# Patient Record
Sex: Male | Born: 1985 | Race: Black or African American | Hispanic: No | Marital: Married | Smoking: Former smoker
Health system: Southern US, Community
[De-identification: ages and names within clinical notes are randomized; demographics above are authoritative.]

## PROBLEM LIST (undated history)

## (undated) DIAGNOSIS — K469 Unspecified abdominal hernia without obstruction or gangrene: Secondary | ICD-10-CM

## (undated) DIAGNOSIS — J40 Bronchitis, not specified as acute or chronic: Secondary | ICD-10-CM

## (undated) DIAGNOSIS — J45909 Unspecified asthma, uncomplicated: Secondary | ICD-10-CM

---
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
## 2009-06-22 ENCOUNTER — Emergency Department (HOSPITAL_COMMUNITY): Admission: EM | Admit: 2009-06-22 | Discharge: 2009-06-22 | Payer: Self-pay | Admitting: Emergency Medicine

## 2009-12-03 ENCOUNTER — Emergency Department (HOSPITAL_COMMUNITY): Admission: EM | Admit: 2009-12-03 | Discharge: 2009-12-03 | Payer: Self-pay | Admitting: Emergency Medicine

## 2011-01-18 LAB — URINALYSIS, ROUTINE W REFLEX MICROSCOPIC
Bilirubin Urine: NEGATIVE
Glucose, UA: NEGATIVE mg/dL
Hgb urine dipstick: NEGATIVE
Specific Gravity, Urine: 1.036 — ABNORMAL HIGH (ref 1.005–1.030)
pH: 5.5 (ref 5.0–8.0)

## 2011-01-18 LAB — GC/CHLAMYDIA PROBE AMP, URINE: Chlamydia, Swab/Urine, PCR: NEGATIVE

## 2011-03-06 ENCOUNTER — Emergency Department (HOSPITAL_COMMUNITY)
Admission: EM | Admit: 2011-03-06 | Discharge: 2011-03-06 | Disposition: A | Discharge status: Home / Self Care | Payer: Self-pay | Attending: Emergency Medicine | Admitting: Emergency Medicine

## 2011-03-06 DIAGNOSIS — K4090 Unilateral inguinal hernia, without obstruction or gangrene, not specified as recurrent: Secondary | ICD-10-CM | POA: Insufficient documentation

## 2011-03-06 DIAGNOSIS — R1909 Other intra-abdominal and pelvic swelling, mass and lump: Secondary | ICD-10-CM | POA: Insufficient documentation

## 2011-12-19 ENCOUNTER — Emergency Department (HOSPITAL_COMMUNITY): Payer: Self-pay

## 2011-12-19 ENCOUNTER — Encounter (HOSPITAL_COMMUNITY): Payer: Self-pay | Admitting: Emergency Medicine

## 2011-12-19 ENCOUNTER — Emergency Department (HOSPITAL_COMMUNITY)
Admission: EM | Admit: 2011-12-19 | Discharge: 2011-12-20 | Disposition: A | Discharge status: Home Health | Payer: Self-pay | Attending: Emergency Medicine | Admitting: Emergency Medicine

## 2011-12-19 DIAGNOSIS — R062 Wheezing: Secondary | ICD-10-CM | POA: Insufficient documentation

## 2011-12-19 DIAGNOSIS — J3489 Other specified disorders of nose and nasal sinuses: Secondary | ICD-10-CM | POA: Insufficient documentation

## 2011-12-19 DIAGNOSIS — J40 Bronchitis, not specified as acute or chronic: Secondary | ICD-10-CM | POA: Insufficient documentation

## 2011-12-19 DIAGNOSIS — R079 Chest pain, unspecified: Secondary | ICD-10-CM | POA: Insufficient documentation

## 2011-12-19 DIAGNOSIS — R05 Cough: Secondary | ICD-10-CM | POA: Insufficient documentation

## 2011-12-19 DIAGNOSIS — H6120 Impacted cerumen, unspecified ear: Secondary | ICD-10-CM | POA: Insufficient documentation

## 2011-12-19 DIAGNOSIS — R1110 Vomiting, unspecified: Secondary | ICD-10-CM | POA: Insufficient documentation

## 2011-12-19 DIAGNOSIS — F17200 Nicotine dependence, unspecified, uncomplicated: Secondary | ICD-10-CM | POA: Insufficient documentation

## 2011-12-19 DIAGNOSIS — J069 Acute upper respiratory infection, unspecified: Secondary | ICD-10-CM | POA: Insufficient documentation

## 2011-12-19 DIAGNOSIS — R059 Cough, unspecified: Secondary | ICD-10-CM | POA: Insufficient documentation

## 2011-12-19 HISTORY — DX: Bronchitis, not specified as acute or chronic: J40

## 2011-12-19 NOTE — ED Notes (Signed)
PT. REPORTS PRODUCTIVE COUGH WITH SOB AND OCCASIONAL LOW GRADE FEVER FOR 1 WEEK .

## 2011-12-20 MED ORDER — ALBUTEROL SULFATE HFA 108 (90 BASE) MCG/ACT IN AERS: 2.0000 | INHALATION_SPRAY | RESPIRATORY_TRACT | Status: DC | PRN

## 2011-12-20 NOTE — ED Provider Notes (Signed)
History     CSN: [id number]  Arrival date & time 12/19/11  2215   First MD Initiated Contact with Patient 12/20/11 0048      Chief Complaint  Patient presents with  . Cough    (Consider location/radiation/quality/duration/timing/severity/associated sxs/prior treatment) HPI Comments: Adam Armstrong is a 26 y.o. Male with nasal congestion, cough, wheezing, vomiting. He feels better today and is eating well. He has used albuterol in the past, but has not needed it recently. He denies fever. He did not try any medicine at home.  The history is provided by the patient.    Past Medical History  Diagnosis Date  . Bronchitis     History reviewed. No pertinent past surgical history.  No family history on file.  History  Substance Use Topics  . Smoking status: Current Everyday Smoker  . Smokeless tobacco: Not on file  . Alcohol Use: Yes      Review of Systems  All other systems reviewed and are negative.    Allergies  Review of patient's allergies indicates no known allergies.  Home Medications   Current Outpatient Rx  Name Route Sig Dispense Refill  . ACETAMINOPHEN 500 MG PO TABS Oral Take 500 mg by mouth every 6 (six) hours as needed. For pain/fever    . DAYQUIL/NYQUIL COLD/FLU RELIEF PO Oral Take 1 tablet by mouth every 6 (six) hours as needed. For cold symptoms    . ALBUTEROL SULFATE HFA 108 (90 BASE) MCG/ACT IN AERS Inhalation Inhale 2 puffs into the lungs every 4 (four) hours as needed for wheezing. 6.7 g 0    BP 132/82  Pulse 71  Temp(Src) 98.4 F (36.9 C) (Oral)  Resp 20  SpO2 99%  Physical Exam  Nursing note and vitals reviewed. Constitutional: He is oriented to person, place, and time. He appears well-developed and well-nourished.  HENT:  Head: Normocephalic and atraumatic.  Left Ear: External ear normal.       Cerumen impaction, right external auditory canal  Eyes: Conjunctivae and EOM are normal. Pupils are equal, round, and reactive to light.    Neck: Normal range of motion and phonation normal. Neck supple.  Cardiovascular: Normal rate, regular rhythm, normal heart sounds and intact distal pulses.   Pulmonary/Chest: Effort normal. He exhibits no bony tenderness.       Mildly decreased expiratory air movement  Abdominal: Soft. Normal appearance. There is no tenderness.  Musculoskeletal: Normal range of motion.  Neurological: He is alert and oriented to person, place, and time. He has normal strength. No cranial nerve deficit or sensory deficit. He exhibits normal muscle tone. Coordination normal.  Skin: Skin is warm, dry and intact.  Psychiatric: He has a normal mood and affect. His behavior is normal. Judgment and thought content normal.    ED Course  Procedures (including critical care time)  Labs Reviewed - No data to display Dg Chest 2 View  12/19/2011  *RADIOLOGY REPORT*  Clinical Data: Chest pain and cough.  CHEST - 2 VIEW  Comparison: None.  Findings: Heart size and vascularity are normal and the lungs are clear.  No effusions.  No osseous abnormality.  IMPRESSION: Normal chest.  Original Report Authenticated By: Gwynn Burly, M.D.     1. Bronchitis   2. URI (upper respiratory infection)       MDM  URI with bronchitis.Marland Kitchen doubt serious bacterial illness or metabolic instability.   Plan: Home Medications- Albuterol inhaler; Home Treatments- Rest, fluids; Recommended follow up- PCP of choice  prn. Also given name of a Surgeon at his request for a groin hernia discovered several months ago.        Flint Melter, MD 12/20/11 [phone]

## 2011-12-20 NOTE — Discharge Instructions (Signed)
Get  [place] of rest and drink a lot of fluids. See your doctor as needed for problems.  Bronchitis Bronchitis is the body's way of reacting to injury and/or infection (inflammation) of the bronchi. Bronchi are the air tubes that extend from the windpipe into the lungs. If the inflammation becomes severe, it may cause shortness of breath. CAUSES  Inflammation may be caused by:  A virus.   Germs (bacteria).   Dust.   Allergens.   Pollutants and many other irritants.  The cells lining the bronchial tree are covered with tiny hairs (cilia). These constantly beat upward, away from the lungs, toward the mouth. This keeps the lungs free of pollutants. When these cells become too irritated and are unable to do their job, mucus begins to develop. This causes the characteristic cough of bronchitis. The cough clears the lungs when the cilia are unable to do their job. Without either of these protective mechanisms, the mucus would settle in the lungs. Then you would develop pneumonia. Smoking is a common cause of bronchitis and can contribute to pneumonia. Stopping this habit is the single most important thing you can do to help yourself. TREATMENT   Your caregiver may prescribe an antibiotic if the cough is caused by bacteria. Also, medicines that open up your airways make it easier to breathe. Your caregiver may also recommend or prescribe an expectorant. It will loosen the mucus to be coughed up. Only take over-the-counter or prescription medicines for pain, discomfort, or fever as directed by your caregiver.   Removing whatever causes the problem (smoking, for example) is critical to preventing the problem from getting worse.   Cough suppressants may be prescribed for relief of cough symptoms.   Inhaled medicines may be prescribed to help with symptoms now and to help prevent problems from returning.   For those with recurrent (chronic) bronchitis, there may be a need for steroid medicines.    SEEK IMMEDIATE MEDICAL CARE IF:   During treatment, you develop more pus-like mucus (purulent sputum).   You have a fever.   Your baby is older than 3 months with a rectal temperature of 102 F (38.9 C) or higher.   Your baby is 70 months old or younger with a rectal temperature of 100.4 F (38 C) or higher.   You become progressively more ill.   You have increased difficulty breathing, wheezing, or shortness of breath.  It is necessary to seek immediate medical care if you are elderly or sick from any other disease. MAKE SURE YOU:   Understand these instructions.   Will watch your condition.   Will get help right away if you are not doing well or get worse.  Document Released: 09/30/2005 Document Revised: 09/19/2011 Document Reviewed: 08/09/2008 [facility] Patient Information 2012 [place], [place].Upper Respiratory Infection, Adult An upper respiratory infection (URI) is also known as the common cold. It is often caused by a type of germ (virus). Colds are easily spread (contagious). You can pass it to others by kissing, coughing, sneezing, or drinking out of the same glass. Usually, you get better in 1 or 2 weeks.  HOME CARE   Only take medicine as told by your doctor.   Use a warm mist humidifier or breathe in steam from a hot shower.   Drink enough water and fluids to keep your pee (urine) clear or pale yellow.   Get plenty of rest.   Return to work when your temperature is back to normal or as told by  your doctor. You may use a face mask and wash your hands to stop your cold from spreading.  GET HELP RIGHT AWAY IF:   After the first few days, you feel you are getting worse.   You have questions about your medicine.   You have chills, shortness of breath, or brown or red spit (mucus).   You have yellow or brown snot (nasal discharge) or pain in the face, especially when you bend forward.   You have a fever, puffy (swollen) neck, pain when you swallow, or white spots  in the back of your throat.   You have a bad headache, ear pain, sinus pain, or chest pain.   You have a high-pitched whistling sound when you breathe in and out (wheezing).   You have a lasting cough or cough up blood.   You have sore muscles or a stiff neck.  MAKE SURE YOU:   Understand these instructions.   Will watch your condition.   Will get help right away if you are not doing well or get worse.  Document Released: 03/18/2008 Document Revised: 09/19/2011 Document Reviewed: 02/04/2011 [facility] Patient Information 2012 [place], [place].

## 2012-01-26 ENCOUNTER — Encounter (HOSPITAL_COMMUNITY): Payer: Self-pay | Admitting: *Deleted

## 2012-01-26 ENCOUNTER — Emergency Department (HOSPITAL_COMMUNITY)
Admission: EM | Admit: 2012-01-26 | Discharge: 2012-01-26 | Discharge status: Against Medical Advice | Payer: Self-pay | Attending: Emergency Medicine | Admitting: Emergency Medicine

## 2012-01-26 DIAGNOSIS — K4090 Unilateral inguinal hernia, without obstruction or gangrene, not specified as recurrent: Secondary | ICD-10-CM | POA: Insufficient documentation

## 2012-01-26 DIAGNOSIS — R339 Retention of urine, unspecified: Secondary | ICD-10-CM | POA: Insufficient documentation

## 2012-01-26 NOTE — ED Notes (Signed)
No answer when called 

## 2012-01-26 NOTE — ED Notes (Signed)
The pt has a hernia  In the inguinal area and he has had difficulty urinating for one hour

## 2012-01-29 ENCOUNTER — Emergency Department (HOSPITAL_COMMUNITY)
Admission: EM | Admit: 2012-01-29 | Discharge: 2012-01-29 | Disposition: A | Discharge status: Home / Self Care | Payer: Self-pay | Attending: Emergency Medicine | Admitting: Emergency Medicine

## 2012-01-29 ENCOUNTER — Encounter (HOSPITAL_COMMUNITY): Payer: Self-pay | Admitting: Emergency Medicine

## 2012-01-29 DIAGNOSIS — K4090 Unilateral inguinal hernia, without obstruction or gangrene, not specified as recurrent: Secondary | ICD-10-CM | POA: Insufficient documentation

## 2012-01-29 DIAGNOSIS — Z79899 Other long term (current) drug therapy: Secondary | ICD-10-CM | POA: Insufficient documentation

## 2012-01-29 DIAGNOSIS — F17200 Nicotine dependence, unspecified, uncomplicated: Secondary | ICD-10-CM | POA: Insufficient documentation

## 2012-01-29 LAB — URINALYSIS, ROUTINE W REFLEX MICROSCOPIC
Ketones, ur: NEGATIVE mg/dL
Leukocytes, UA: NEGATIVE
Nitrite: NEGATIVE
Specific Gravity, Urine: 1.021 (ref 1.005–1.030)
pH: 6.5 (ref 5.0–8.0)

## 2012-01-29 MED ORDER — HYDROCODONE-ACETAMINOPHEN 5-325 MG PO TABS: 1.0000 | ORAL_TABLET | ORAL | Status: AC | PRN

## 2012-01-29 NOTE — Discharge Instructions (Signed)
Inguinal Hernia, Adult Muscles help keep everything in the body in its proper place. But if a weak spot in the muscles develops, something can poke through. That is called a hernia. When this happens in the lower part of the belly (abdomen), it is called an inguinal hernia. (It takes its name from a part of the body in this region called the inguinal canal.) A weak spot in the wall of muscles lets some fat or part of the small intestine bulge through. An inguinal hernia can develop at any age. Men get them more often than women. CAUSES  In adults, an inguinal hernia develops over time.  It can be triggered by:   Suddenly straining the muscles of the lower abdomen.   Lifting heavy objects.   Straining to have a bowel movement. Difficult bowel movements (constipation) can lead to this.   Constant coughing. This may be caused by smoking or lung disease.   Being overweight.   Being pregnant.   Working at a job that requires long periods of standing or heavy lifting.   Having had an inguinal hernia before.  One type can be an emergency situation. It is called a strangulated inguinal hernia. It develops if part of the small intestine slips through the weak spot and cannot get back into the abdomen. The blood supply can be cut off. If that happens, part of the intestine may die. This situation requires emergency surgery. SYMPTOMS  Often, a small inguinal hernia has no symptoms. It is found when a healthcare provider does a physical exam. Larger hernias usually have symptoms.   In adults, symptoms may include:   A lump in the groin. This is easier to see when the person is standing. It might disappear when lying down.   In men, a lump in the scrotum.   Pain or burning in the groin. This occurs especially when lifting, straining or coughing.   A dull ache or feeling of pressure in the groin.   Signs of a strangulated hernia can include:   A bulge in the groin that becomes very painful  and tender to the touch.   A bulge that turns red or purple.   Fever, nausea and vomiting.   Inability to have a bowel movement or to pass gas.  DIAGNOSIS  To decide if you have an inguinal hernia, a healthcare provider will probably do a physical examination.  This will include asking questions about any symptoms you have noticed.   The healthcare provider might feel the groin area and ask you to cough. If an inguinal hernia is felt, the healthcare provider may try to slide it back into the abdomen.   Usually no other tests are needed.  TREATMENT  Treatments can vary. The size of the hernia makes a difference. Options include:  Watchful waiting. This is often suggested if the hernia is small and you have had no symptoms.   No medical procedure will be done unless symptoms develop.   You will need to watch closely for symptoms. If any occur, contact your healthcare provider right away.   Surgery. This is used if the hernia is larger or you have symptoms.   Open surgery. This is usually an outpatient procedure (you will not stay overnight in a hospital). An cut (incision) is made through the skin in the groin. The hernia is put back inside the abdomen. The weak area in the muscles is then repaired by herniorrhaphy or hernioplasty. Herniorrhaphy: in this type of   surgery, the weak muscles are sewn back together. Hernioplasty: a patch or mesh is used to close the weak area in the abdominal wall.   Laparoscopy. In this procedure, a surgeon makes small incisions. A thin tube with a tiny video camera (called a laparoscope) is put into the abdomen. The surgeon repairs the hernia with mesh by looking with the video camera and using two long instruments.  HOME CARE INSTRUCTIONS   After surgery to repair an inguinal hernia:   You will need to take pain medicine prescribed by your healthcare provider. Follow all directions carefully.   You will need to take care of the wound from the incision.    Your activity will be restricted for awhile. This will probably include no heavy lifting for several weeks. You also should not do anything too active for a few weeks. When you can return to work will depend on the type of job that you have.   During "watchful waiting" periods, you should:   Maintain a healthy weight.   Eat a diet high in fiber (fruits, vegetables and whole grains).   Drink plenty of fluids to avoid constipation. This means drinking enough water and other liquids to keep your urine clear or pale yellow.   Do not lift heavy objects.   Do not stand for long periods of time.   Quit smoking. This should keep you from developing a frequent cough.  SEEK MEDICAL CARE IF:   A bulge develops in your groin area.   You feel pain, a burning sensation or pressure in the groin. This might be worse if you are lifting or straining.   You develop a fever of more than 100.5 F (38.1 C).  SEEK IMMEDIATE MEDICAL CARE IF:   Pain in the groin increases suddenly.   A bulge in the groin gets bigger suddenly and does not go down.   For men, there is sudden pain in the scrotum. Or, the size of the scrotum increases.   A bulge in the groin area becomes red or purple and is painful to touch.   You have nausea or vomiting that does not go away.   You feel your heart beating much faster than normal.   You cannot have a bowel movement or pass gas.   You develop a fever of more than 102.0 F (38.9 C).  Document Released: 02/16/2009 Document Revised: 09/19/2011 Document Reviewed: 02/16/2009 [facility] Patient Information 2012 [place], [place].   Inguinal Hernia, Adult Muscles help keep everything in the body in its proper place. But if a weak spot in the muscles develops, something can poke through. That is called a hernia. When this happens in the lower part of the belly (abdomen), it is called an inguinal hernia. (It takes its name from a part of the body in this region called the  inguinal canal.) A weak spot in the wall of muscles lets some fat or part of the small intestine bulge through. An inguinal hernia can develop at any age. Men get them more often than women. CAUSES  In adults, an inguinal hernia develops over time.  It can be triggered by:   Suddenly straining the muscles of the lower abdomen.   Lifting heavy objects.   Straining to have a bowel movement. Difficult bowel movements (constipation) can lead to this.   Constant coughing. This may be caused by smoking or lung disease.   Being overweight.   Being pregnant.   Working at a job that requires  long periods of standing or heavy lifting.   Having had an inguinal hernia before.  One type can be an emergency situation. It is called a strangulated inguinal hernia. It develops if part of the small intestine slips through the weak spot and cannot get back into the abdomen. The blood supply can be cut off. If that happens, part of the intestine may die. This situation requires emergency surgery. SYMPTOMS  Often, a small inguinal hernia has no symptoms. It is found when a healthcare provider does a physical exam. Larger hernias usually have symptoms.   In adults, symptoms may include:   A lump in the groin. This is easier to see when the person is standing. It might disappear when lying down.   In men, a lump in the scrotum.   Pain or burning in the groin. This occurs especially when lifting, straining or coughing.   A dull ache or feeling of pressure in the groin.   Signs of a strangulated hernia can include:   A bulge in the groin that becomes very painful and tender to the touch.   A bulge that turns red or purple.   Fever, nausea and vomiting.   Inability to have a bowel movement or to pass gas.  DIAGNOSIS  To decide if you have an inguinal hernia, a healthcare provider will probably do a physical examination.  This will include asking questions about any symptoms you have noticed.    The healthcare provider might feel the groin area and ask you to cough. If an inguinal hernia is felt, the healthcare provider may try to slide it back into the abdomen.   Usually no other tests are needed.  TREATMENT  Treatments can vary. The size of the hernia makes a difference. Options include:  Watchful waiting. This is often suggested if the hernia is small and you have had no symptoms.   No medical procedure will be done unless symptoms develop.   You will need to watch closely for symptoms. If any occur, contact your healthcare provider right away.   Surgery. This is used if the hernia is larger or you have symptoms.   Open surgery. This is usually an outpatient procedure (you will not stay overnight in a hospital). An cut (incision) is made through the skin in the groin. The hernia is put back inside the abdomen. The weak area in the muscles is then repaired by herniorrhaphy or hernioplasty. Herniorrhaphy: in this type of surgery, the weak muscles are sewn back together. Hernioplasty: a patch or mesh is used to close the weak area in the abdominal wall.   Laparoscopy. In this procedure, a surgeon makes small incisions. A thin tube with a tiny video camera (called a laparoscope) is put into the abdomen. The surgeon repairs the hernia with mesh by looking with the video camera and using two long instruments.  HOME CARE INSTRUCTIONS   After surgery to repair an inguinal hernia:   You will need to take pain medicine prescribed by your healthcare provider. Follow all directions carefully.   You will need to take care of the wound from the incision.   Your activity will be restricted for awhile. This will probably include no heavy lifting for several weeks. You also should not do anything too active for a few weeks. When you can return to work will depend on the type of job that you have.   During "watchful waiting" periods, you should:   Maintain a healthy weight.  Eat a diet  high in fiber (fruits, vegetables and whole grains).   Drink plenty of fluids to avoid constipation. This means drinking enough water and other liquids to keep your urine clear or pale yellow.   Do not lift heavy objects.   Do not stand for long periods of time.   Quit smoking. This should keep you from developing a frequent cough.  SEEK MEDICAL CARE IF:   A bulge develops in your groin area.   You feel pain, a burning sensation or pressure in the groin. This might be worse if you are lifting or straining.   You develop a fever of more than 100.5 F (38.1 C).  SEEK IMMEDIATE MEDICAL CARE IF:   Pain in the groin increases suddenly.   A bulge in the groin gets bigger suddenly and does not go down.   For men, there is sudden pain in the scrotum. Or, the size of the scrotum increases.   A bulge in the groin area becomes red or purple and is painful to touch.   You have nausea or vomiting that does not go away.   You feel your heart beating much faster than normal.   You cannot have a bowel movement or pass gas.   You develop a fever of more than 102.0 F (38.9 C).  Document Released: 02/16/2009 Document Revised: 09/19/2011 Document Reviewed: 02/16/2009 [facility] Patient Information 2012 [place], [place].   Call Dr. Andrey Campanile for further management of your hernia.  Avoid lifting heavy objects until this gets repaired.  In the interim, return here if you develop any swelling and pain that does not get better with gentle pressure.  You may take the hydrocodone prescribed for pain relief.  This will make you drowsy - do not drive within 4 hours of taking this medication.

## 2012-01-29 NOTE — ED Notes (Signed)
Pt notified that urine sample is needed 

## 2012-01-29 NOTE — ED Notes (Signed)
PT. REPORTS LEFT GROIN PAIN / PRESSURE FOR 4 DAYS , DENIES INJURY , PT. CONCERNED ABOUT HERNIA.

## 2012-02-01 NOTE — ED Provider Notes (Signed)
History     CSN: [id number]  Arrival date & time 01/29/12  1928   First MD Initiated Contact with Patient 01/29/12 2111      Chief Complaint  Patient presents with  . Groin Pain    (Consider location/radiation/quality/duration/timing/severity/associated sxs/prior treatment) HPI Comments: Adam Armstrong presents for a recheck of a known left inguinal hernia which he was diagnosed here months ago.  He reports more frequent episodes of sharp pain in his left groin over the past 4 days which is worsened with lifting (lifts with his job, but has also started a new weight training regimen) and resolves by applying gentle pressure at the site.  He lost the surgical referral he was given at his last visit and requests this information tonight so he can get definitive treatment of this. He denies fevers,  Chills, nausea,  Vomiting,  Diarrhea,  Denies rectal bleeding or change in bowel habits.     Patient is a 26 y.o. male presenting with groin pain. The history is provided by the patient.  Groin Pain This is a recurrent problem. The current episode started in the past 7 days. Associated symptoms include abdominal pain. Pertinent negatives include no arthralgias, chest pain, congestion, fever, headaches, joint swelling, nausea, neck pain, numbness, rash, sore throat or weakness.    Past Medical History  Diagnosis Date  . Bronchitis     History reviewed. No pertinent past surgical history.  No family history on file.  History  Substance Use Topics  . Smoking status: Current Everyday Smoker  . Smokeless tobacco: Not on file  . Alcohol Use: Yes      Review of Systems  Constitutional: Negative for fever.  HENT: Negative for congestion, sore throat and neck pain.   Eyes: Negative.   Respiratory: Negative for chest tightness and shortness of breath.   Cardiovascular: Negative for chest pain.  Gastrointestinal: Positive for abdominal pain. Negative for nausea.  Genitourinary:  Negative.  Negative for penile swelling, scrotal swelling, difficulty urinating and testicular pain.  Musculoskeletal: Negative for joint swelling and arthralgias.  Skin: Negative.  Negative for rash and wound.  Neurological: Negative for dizziness, weakness, light-headedness, numbness and headaches.  Hematological: Negative.     Allergies  Review of patient's allergies indicates no known allergies.  Home Medications   Current Outpatient Rx  Name Route Sig Dispense Refill  . ALBUTEROL SULFATE HFA 108 (90 BASE) MCG/ACT IN AERS Inhalation Inhale 2 puffs into the lungs every 4 (four) hours as needed for wheezing. 6.7 g 0  . IBUPROFEN 200 MG PO TABS Oral Take 400 mg by mouth every 6 (six) hours as needed. For pain    . HYDROCODONE-ACETAMINOPHEN 5-325 MG PO TABS Oral Take 1 tablet by mouth every 4 (four) hours as needed for pain. 15 tablet 0    BP 125/64  Pulse 71  Temp(Src) 98.5 F (36.9 C) (Oral)  Resp 18  SpO2 98%  Physical Exam  Nursing note and vitals reviewed. Constitutional: He appears well-developed and well-nourished.  HENT:  Head: Normocephalic and atraumatic.  Eyes: Conjunctivae are normal.  Neck: Normal range of motion.  Cardiovascular: Normal rate, regular rhythm, normal heart sounds and intact distal pulses.   Pulmonary/Chest: Effort normal and breath sounds normal. He has no wheezes.  Abdominal: Soft. Bowel sounds are normal. He exhibits mass. There is no tenderness.       Left inguinal swelling which worsens with valsalva and resolves completely with gentle pressure while supine.   Genitourinary: Right  testis shows no mass, no swelling and no tenderness. Left testis shows no mass, no swelling and no tenderness.  Musculoskeletal: Normal range of motion.  Neurological: He is alert.  Skin: Skin is warm and dry.  Psychiatric: He has a normal mood and affect.    ED Course  Procedures (including critical care time)   Labs Reviewed  URINALYSIS, ROUTINE W REFLEX  MICROSCOPIC  LAB REPORT - SCANNED   No results found.   1. Direct inguinal hernia     Chaperone was present during exam.   MDM  Easily reducible direct inguinal hernia.  Referral to gen surg given.  Strict return precautions given- return here if worse pain that does not improve with gentle pressure, fevers,  Vomiting.        Candis Musa, PA 02/01/12 [phone]

## 2012-02-02 NOTE — ED Provider Notes (Signed)
Medical screening examination/treatment/procedure(s) were performed by non-physician practitioner and as supervising physician I was immediately available for consultation/collaboration.   Glynn Octave, MD 02/02/12 1438

## 2012-06-14 ENCOUNTER — Emergency Department (HOSPITAL_COMMUNITY)
Admission: EM | Admit: 2012-06-14 | Discharge: 2012-06-14 | Disposition: A | Discharge status: Home / Self Care | Payer: Self-pay | Attending: Emergency Medicine | Admitting: Emergency Medicine

## 2012-06-14 ENCOUNTER — Encounter (HOSPITAL_COMMUNITY): Payer: Self-pay | Admitting: *Deleted

## 2012-06-14 DIAGNOSIS — K625 Hemorrhage of anus and rectum: Secondary | ICD-10-CM | POA: Insufficient documentation

## 2012-06-14 DIAGNOSIS — F17200 Nicotine dependence, unspecified, uncomplicated: Secondary | ICD-10-CM | POA: Insufficient documentation

## 2012-06-14 DIAGNOSIS — F1210 Cannabis abuse, uncomplicated: Secondary | ICD-10-CM | POA: Insufficient documentation

## 2012-06-14 HISTORY — DX: Unspecified abdominal hernia without obstruction or gangrene: K46.9

## 2012-06-14 LAB — CBC WITH DIFFERENTIAL/PLATELET
Basophils Absolute: 0 K/uL (ref 0.0–0.1)
Basophils Relative: 0 % (ref 0–1)
Eosinophils Absolute: 0.6 K/uL (ref 0.0–0.7)
Eosinophils Relative: 8 % — ABNORMAL HIGH (ref 0–5)
HCT: 40.3 % (ref 39.0–52.0)
Hemoglobin: 13.4 g/dL (ref 13.0–17.0)
Lymphocytes Relative: 23 % (ref 12–46)
Lymphs Abs: 1.8 K/uL (ref 0.7–4.0)
MCH: 28.2 pg (ref 26.0–34.0)
MCHC: 33.3 g/dL (ref 30.0–36.0)
MCV: 84.7 fL (ref 78.0–100.0)
Monocytes Absolute: 1 K/uL (ref 0.1–1.0)
Monocytes Relative: 13 % — ABNORMAL HIGH (ref 3–12)
Neutro Abs: 4.4 K/uL (ref 1.7–7.7)
Neutrophils Relative %: 56 % (ref 43–77)
Platelets: 208 K/uL (ref 150–400)
RBC: 4.76 MIL/uL (ref 4.22–5.81)
RDW: 14.3 % (ref 11.5–15.5)
WBC: 7.9 K/uL (ref 4.0–10.5)

## 2012-06-14 LAB — COMPREHENSIVE METABOLIC PANEL WITH GFR
ALT: 28 U/L (ref 0–53)
AST: 32 U/L (ref 0–37)
Albumin: 4.3 g/dL (ref 3.5–5.2)
Alkaline Phosphatase: 61 U/L (ref 39–117)
BUN: 17 mg/dL (ref 6–23)
CO2: 24 meq/L (ref 19–32)
Calcium: 9.6 mg/dL (ref 8.4–10.5)
Chloride: 105 meq/L (ref 96–112)
Creatinine, Ser: 1.04 mg/dL (ref 0.50–1.35)
GFR calc Af Amer: >90 mL/min
GFR calc non Af Amer: >90 mL/min
Glucose, Bld: 104 mg/dL — ABNORMAL HIGH (ref 70–99)
Potassium: 4 meq/L (ref 3.5–5.1)
Sodium: 138 meq/L (ref 135–145)
Total Bilirubin: 0.3 mg/dL (ref 0.3–1.2)
Total Protein: 7.5 g/dL (ref 6.0–8.3)

## 2012-06-14 MED ORDER — DOCUSATE SODIUM 250 MG PO CAPS: 100.0000 mg | ORAL_CAPSULE | Freq: Every day | ORAL | Status: AC

## 2012-06-14 MED ORDER — ALBUTEROL SULFATE HFA 108 (90 BASE) MCG/ACT IN AERS: 2.0000 | INHALATION_SPRAY | RESPIRATORY_TRACT | Status: AC | PRN

## 2012-06-14 NOTE — ED Notes (Signed)
Pt reports lower abdominal pain and rectal bleeding since this am, reports no hx of the same.

## 2012-06-14 NOTE — ED Provider Notes (Addendum)
History     CSN: [id number]  Arrival date & time 06/14/12  4098   First MD Initiated Contact with Patient 06/14/12 1117      Chief Complaint  Patient presents with  . Rectal Bleeding  . Abdominal Pain    (Consider location/radiation/quality/duration/timing/severity/associated sxs/prior treatment) HPI Complains of pain at anus and blood mixed with stool onset this morning. Patient has had vague lower abdominal pain this morning, feeling that he needs to have a bowel movement. He treated himself with a laxative subsequently had pain at anus and blood mixed bowel movement. No other associated symptoms. No fever no vomiting. Pain at anus worse with bowel movement  Past Medical History  Diagnosis Date  . Bronchitis   . Hernia     History reviewed. No pertinent past surgical history.  History reviewed. No pertinent family history.  History  Substance Use Topics  . Smoking status: Current Everyday Smoker  . Smokeless tobacco: Not on file  . Alcohol Use: Yes     occ   positive marijuana use    Review of Systems  Constitutional: Negative.   HENT: Negative.  Negative for drooling.   Respiratory: Negative.   Cardiovascular: Negative.   Gastrointestinal: Positive for abdominal pain, blood in stool and rectal pain.  Musculoskeletal: Negative.   Skin: Negative.   Neurological: Negative.   Hematological: Negative.   Psychiatric/Behavioral: Negative.     Allergies  Review of patient's allergies indicates no known allergies.  Home Medications   Current Outpatient Rx  Name Route Sig Dispense Refill  . ALBUTEROL SULFATE HFA 108 (90 BASE) MCG/ACT IN AERS Inhalation Inhale 2 puffs into the lungs every 4 (four) hours as needed for wheezing. 6.7 g 0  . BC HEADACHE POWDER PO Oral Take 1 packet by mouth daily as needed. For headache    . B COMPLEX PO TABS Oral Take 1 tablet by mouth daily.    Marland Kitchen VITAMIN B-12 PO Oral Take 1 tablet by mouth daily.    Jule Ser EX Apply externally  Apply 1 application topically daily as needed. For knee pain    . ADULT MULTIVITAMIN W/MINERALS CH Oral Take 1 tablet by mouth daily.    Marland Kitchen FISH OIL PO Oral Take 2 capsules by mouth daily.    Marland Kitchen OVER THE COUNTER MEDICATION Oral Take 1 tablet by mouth 2 (two) times daily as needed. [facility] brand bronchial pills    . VITAMIN B-6 PO Oral Take 1 tablet by mouth daily.      BP 133/87  Pulse 64  Temp 99.1 F (37.3 C) (Oral)  Resp 16  SpO2 99%  Physical Exam  Genitourinary: Prostate normal and penis normal.       Brown stool mixed with slight amount of gross blood on rectal exam rectal exam grossly tender to    ED Course  Procedures (including critical care time)  Labs Reviewed  CBC WITH DIFFERENTIAL - Abnormal; Notable for the following:    Monocytes Relative 13 (*)     Eosinophils Relative 8 (*)     All other components within normal limits  COMPREHENSIVE METABOLIC PANEL - Abnormal; Notable for the following:    Glucose, Bld 104 (*)     All other components within normal limits   No results found. Procedure: Anoscopy performed by me. Timeout performed no bleeding site visualized  No diagnosis found.  Results for orders placed during the hospital encounter of 06/14/12  CBC WITH DIFFERENTIAL      Component  Value Range   WBC 7.9  4.0 - 10.5 K/uL   RBC 4.76  4.22 - 5.81 MIL/uL   Hemoglobin 13.4  13.0 - 17.0 g/dL   HCT 16.1  09.6 - 04.5 %   MCV 84.7  78.0 - 100.0 fL   MCH 28.2  26.0 - 34.0 pg   MCHC 33.3  30.0 - 36.0 g/dL   RDW 40.9  81.1 - 91.4 %   Platelets 208  150 - 400 K/uL   Neutrophils Relative 56  43 - 77 %   Neutro Abs 4.4  1.7 - 7.7 K/uL   Lymphocytes Relative 23  12 - 46 %   Lymphs Abs 1.8  0.7 - 4.0 K/uL   Monocytes Relative 13 (*) 3 - 12 %   Monocytes Absolute 1.0  0.1 - 1.0 K/uL   Eosinophils Relative 8 (*) 0 - 5 %   Eosinophils Absolute 0.6  0.0 - 0.7 K/uL   Basophils Relative 0  0 - 1 %   Basophils Absolute 0.0  0.0 - 0.1 K/uL  COMPREHENSIVE METABOLIC  PANEL      Component Value Range   Sodium 138  135 - 145 mEq/L   Potassium 4.0  3.5 - 5.1 mEq/L   Chloride 105  96 - 112 mEq/L   CO2 24  19 - 32 mEq/L   Glucose, Bld 104 (*) 70 - 99 mg/dL   BUN 17  6 - 23 mg/dL   Creatinine, Ser 7.82  0.50 - 1.35 mg/dL   Calcium 9.6  8.4 - 95.6 mg/dL   Total Protein 7.5  6.0 - 8.3 g/dL   Albumin 4.3  3.5 - 5.2 g/dL   AST 32  0 - 37 U/L   ALT 28  0 - 53 U/L   Alkaline Phosphatase 61  39 - 117 U/L   Total Bilirubin 0.3  0.3 - 1.2 mg/dL   GFR calc non Af Amer >90  >90 mL/min   GFR calc Af Amer >90  >90 mL/min   No results found.   MDM  Plan prescription [facility] Gastroenterologist referral Diagnosis rectal bleeding        Doug Sou, MD 06/14/12 1211 Addendum of albuterol HFA written for patient a at his requestas he has run out  Doug Sou, MD 06/14/12 1222

## 2016-01-29 ENCOUNTER — Other Ambulatory Visit: Payer: Self-pay | Admitting: Surgery

## 2016-01-29 NOTE — H&P (Signed)
Adam Armstrong 01/29/2016 3:25 PM Location: [facility] Patient #: [id number] DOB: [birth year] Married / Language: English / Race: Black or African American Male  History of Present Illness Adam Armstrong(Adam Doring [name] MD; 01/29/2016 6:14 PM) The patient is a 30 year old male who presents with an inguinal hernia. Note for "Inguinal hernia": Patient sent for surgical consultation by Dr. Everlene OtherBouska. Concern of left inguinal hernia.  Pleasant active male. Works in the produce department. Felt a pop and discomfort in his left groin 3 years ago. No prior surgery. He works at [employer]. 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 He is noted that the groin swelling is gotten larger. No comes out rather easily. Comes rather uncomfortable by the end of the day. Usually reduces when he sleeping. Usually has a few bowel movements a day. No severe constipation. No history of skin infections. No history of abdominal hernia surgery. Because of the worsening symptoms, he discussed with his primary care physician. Surgical consultation recommended for repair   Other Problems Adam Armstrong(Adam Armstrong, Armstrong; 01/29/2016 3:25 PM) Bladder Problems  Past Surgical History Adam Armstrong(Adam Armstrong, Armstrong; 01/29/2016 3:25 PM) No pertinent past surgical history  Diagnostic Studies History Adam Armstrong(Adam Armstrong, Armstrong; 01/29/2016 3:25 PM) Colonoscopy within last year  Allergies Adam Armstrong(Adam Armstrong, Armstrong; 01/29/2016 3:26 PM) No Known Drug Allergies 01/29/2016  Medication History Adam Armstrong(Adam Armstrong, Armstrong; 01/29/2016 3:26 PM) No Current Medications Medications Reconciled  Social History Adam Armstrong(Adam Armstrong, Armstrong; 01/29/2016 3:25 PM) Alcohol use Moderate alcohol use. Caffeine use Coffee. No drug use Tobacco use Current some day smoker.  Family History Adam Armstrong(Adam Armstrong, New Armstrong; 01/29/2016 3:25 PM) Diabetes Mellitus Father. Kidney Disease Father.     Review of Systems Adam Armstrong(Adam Armstrong; 01/29/2016 3:25 PM) General Present- Weight Gain.  Not Present- Appetite Loss, Chills, Fatigue, Fever, Night Sweats and Weight Loss. Skin Not Present- Change in Wart/Mole, Dryness, Hives, Jaundice, New Lesions, Non-Healing Wounds, Rash and Ulcer. HEENT Present- Seasonal Allergies. Not Present- Earache, Hearing Loss, Hoarseness, Nose Bleed, Oral Ulcers, Ringing in the Ears, Sinus Pain, Sore Throat, Visual Disturbances, Wears glasses/contact lenses and Yellow Eyes. Respiratory Present- Snoring. Not Present- Bloody sputum, Chronic Cough, Difficulty Breathing and Wheezing. Psychiatric Not Present- Anxiety, Bipolar, Change in Sleep Pattern, Depression, Fearful and Frequent crying. Endocrine Not Present- Cold Intolerance, Excessive Hunger, Hair Changes, Heat Intolerance, Hot flashes and New Diabetes. Hematology Not Present- Easy Bruising, Excessive bleeding, Gland problems, HIV and Persistent Infections.  Vitals Adam Armstrong(Adam Armstrong; 01/29/2016 3:26 PM) 01/29/2016 3:26 PM Weight: 223 lb Height: 70in Body Surface Area: 2.19 m Body Mass Index: 32 kg/m  Temp.: 97.65F(Tympanic)  Pulse: 88 (Regular)  BP: 140/88 (Sitting, Left Arm, Standard)      Physical Exam Adam Armstrong(Adam Basto C. [name] MD; 01/29/2016 4:16 PM)  General Mental Status-Alert. General Appearance-Not in acute distress, Not Sickly. Orientation-Oriented X3. Hydration-Well hydrated. Voice-Normal.  Integumentary Global Assessment Upon inspection and palpation of skin surfaces of the - Axillae: non-tender, no inflammation or ulceration, no drainage. and Distribution of scalp and body hair is normal. General Characteristics Temperature - normal warmth is noted.  Head and Neck Head-normocephalic, atraumatic with no lesions or palpable masses. Face Global Assessment - atraumatic, no absence of expression. Neck Global Assessment - no abnormal movements, no bruit auscultated on the right, no bruit auscultated on the left, no decreased range of motion,  non-tender. Trachea-midline. Thyroid Gland Characteristics - non-tender.  Eye Eyeball - Left-Extraocular movements intact, No Nystagmus. Eyeball - Right-Extraocular movements intact, No Nystagmus. Cornea - Left-No Hazy.  Cornea - Right-No Hazy. Sclera/Conjunctiva - Left-No scleral icterus, No Discharge. Sclera/Conjunctiva - Right-No scleral icterus, No Discharge. Pupil - Left-Direct reaction to light normal. Pupil - Right-Direct reaction to light normal.  ENMT Ears Pinna - Left - no drainage observed, no generalized tenderness observed. Right - no drainage observed, no generalized tenderness observed. Nose and Sinuses External Inspection of the Nose - no destructive lesion observed. Inspection of the nares - Left - quiet respiration. Right - quiet respiration. Mouth and Throat Lips - Upper Lip - no fissures observed, no pallor noted. Lower Lip - no fissures observed, no pallor noted. Nasopharynx - no discharge present. Oral Cavity/Oropharynx - Tongue - no dryness observed. Oral Mucosa - no cyanosis observed. Hypopharynx - no evidence of airway distress observed.  Chest and Lung Exam Inspection Movements - Normal and Symmetrical. Accessory muscles - No use of accessory muscles in breathing. Palpation Palpation of the chest reveals - Non-tender. Auscultation Breath sounds - Normal and Clear.  Cardiovascular Auscultation Rhythm - Regular. Murmurs & Other Heart Sounds - Auscultation of the heart reveals - No Murmurs and No Systolic Clicks.  Abdomen Inspection Inspection of the abdomen reveals - No Visible peristalsis and No Abnormal pulsations. Umbilicus - No Bleeding, No Urine drainage. Palpation/Percussion Palpation and Percussion of the abdomen reveal - Soft, Non Tender, No Rebound tenderness, No Rigidity (guarding) and No Cutaneous hyperesthesia. Note: Soft and flat. Muscular. No diastases. No umbilical hernia. No incisions.  Male Genitourinary Sexual  Maturity Tanner 5 - Adult hair pattern and Adult penile size and shape. Note: Normal external genitalia. Epididymi, testes, and spermatic cords normal without any masses. Bulge with Valsalva on left groin strongly suggestive of left inguinal hernia. Milder at right groin.  Peripheral Vascular Upper Extremity Inspection - Left - No Cyanotic nailbeds, Not Ischemic. Right - No Cyanotic nailbeds, Not Ischemic.  Neurologic Neurologic evaluation reveals -normal attention span and ability to concentrate, able to name objects and repeat phrases. Appropriate fund of knowledge , normal sensation and normal coordination. Mental Status Affect - not angry, not paranoid. Cranial Nerves-Normal Bilaterally. Gait-Normal.  Neuropsychiatric Mental status exam performed with findings of-able to articulate well with normal speech/language, rate, volume and coherence, thought content normal with ability to perform basic computations and apply abstract reasoning and no evidence of hallucinations, delusions, obsessions or homicidal/suicidal ideation.  Musculoskeletal Global Assessment Spine, Ribs and Pelvis - no instability, subluxation or laxity. Right Upper Extremity - no instability, subluxation or laxity.  Lymphatic Head & Neck  General Head & Neck Lymphatics: Bilateral - Description - No Localized lymphadenopathy. Axillary  General Axillary Region: Bilateral - Description - No Localized lymphadenopathy. Femoral & Inguinal  Generalized Femoral & Inguinal Lymphatics: Left - Description - No Localized lymphadenopathy. Right - Description - No Localized lymphadenopathy.    Assessment & Plan Adam Sportsman MD; 01/29/2016 6:14 PM)  LEFT INGUINAL HERNIA (K40.90) Impression: Classic history for left inguinal hernia. Very suspicious on exam. Possible small right inguinal hernias well.  I think he would benefit from repair, especially given his young age and moderate physical activity. He is  interested in proceeding. He would definitely like both sides repaired if there is any hint of hernia contralaterally.  Can do heat and ice. Anti-inflammatories. Apparently father had issues with stomach ulcers on ibuprofen 6 of the tendo avoid it. Therefore triaxial strength Tylenol. She discussed with the managers he should be able to go back to light duty at ~ 3weeks for a while and not have full duty  at 6 weeks. I think that's a reasonable plan  ENCOUNTER FOR PRE-OP - GROIN HERNIA (Z01.818)  Current Plans You are being scheduled for surgery - Our schedulers will call you.  You should hear from our office's scheduling department within 5 working days about the location, date, and time of surgery. We try to make accommodations for patient's preferences in scheduling surgery, but sometimes the OR schedule or the surgeon's schedule prevents [place] from making those accommodations.  If you have not heard from our office [phone]) in 5 working days, call the office and ask for your surgeon's nurse.  If you have other questions about your diagnosis, plan, or surgery, call the office and ask for your surgeon's nurse.  Pt Education - Pamphlet Given - Laparoscopic Hernia Repair: discussed with patient and provided information. The anatomy & physiology of the abdominal wall and pelvic floor was discussed. The pathophysiology of hernias in the inguinal and pelvic region was discussed. Natural history risks such as progressive enlargement, pain, incarceration, and strangulation was discussed. Contributors to complications such as smoking, obesity, diabetes, prior surgery, etc were discussed.  I feel the risks of no intervention will lead to serious problems that outweigh the operative risks; therefore, I recommended surgery to reduce and repair the hernia. I explained laparoscopic techniques with possible need for an open approach. I noted usual use of mesh to patch and/or buttress hernia repair  Risks  such as bleeding, infection, abscess, need for further treatment, heart attack, death, and other risks were discussed. I noted a good likelihood this will help address the problem. Goals of post-operative recovery were discussed as well. Possibility that this will not correct all symptoms was explained. I stressed the importance of low-impact activity, aggressive pain control, avoiding constipation, & not pushing through pain to minimize risk of post-operative chronic pain or injury. Possibility of reherniation was discussed. We will work to minimize complications.  An educational handout further explaining the pathology & treatment options was given as well. Questions were answered. The patient expresses understanding & wishes to proceed with surgery.  Pt Education - [facility] Hernia Post-Op HCI (Adam Armstrong): discussed with patient and provided information. Pt Education - [facility] Pain control - tylenol only: discussed with patient and provided information.  Adam Armstrong, M.D., F.A.C.S. Gastrointestinal and Minimally Invasive Surgery [facility] [address], [address] [place], [place] [address] [phone] Main / Paging

## 2016-02-24 ENCOUNTER — Emergency Department (HOSPITAL_COMMUNITY): Payer: Managed Care, Other (non HMO)

## 2016-02-24 ENCOUNTER — Encounter (HOSPITAL_COMMUNITY): Payer: Self-pay

## 2016-02-24 ENCOUNTER — Emergency Department (HOSPITAL_COMMUNITY)
Admission: EM | Admit: 2016-02-24 | Discharge: 2016-02-24 | Disposition: A | Discharge status: Home / Self Care | Payer: Managed Care, Other (non HMO) | Attending: Emergency Medicine | Admitting: Emergency Medicine

## 2016-02-24 DIAGNOSIS — F17200 Nicotine dependence, unspecified, uncomplicated: Secondary | ICD-10-CM | POA: Insufficient documentation

## 2016-02-24 DIAGNOSIS — S301XXA Contusion of abdominal wall, initial encounter: Secondary | ICD-10-CM | POA: Insufficient documentation

## 2016-02-24 DIAGNOSIS — M79641 Pain in right hand: Secondary | ICD-10-CM

## 2016-02-24 DIAGNOSIS — Z7982 Long term (current) use of aspirin: Secondary | ICD-10-CM | POA: Insufficient documentation

## 2016-02-24 DIAGNOSIS — Y92410 Unspecified street and highway as the place of occurrence of the external cause: Secondary | ICD-10-CM | POA: Diagnosis not present

## 2016-02-24 DIAGNOSIS — S8991XA Unspecified injury of right lower leg, initial encounter: Secondary | ICD-10-CM | POA: Diagnosis present

## 2016-02-24 DIAGNOSIS — S80211A Abrasion, right knee, initial encounter: Secondary | ICD-10-CM

## 2016-02-24 DIAGNOSIS — Z79899 Other long term (current) drug therapy: Secondary | ICD-10-CM | POA: Diagnosis not present

## 2016-02-24 DIAGNOSIS — Y998 Other external cause status: Secondary | ICD-10-CM | POA: Diagnosis not present

## 2016-02-24 DIAGNOSIS — Y9389 Activity, other specified: Secondary | ICD-10-CM | POA: Diagnosis not present

## 2016-02-24 DIAGNOSIS — S6991XA Unspecified injury of right wrist, hand and finger(s), initial encounter: Secondary | ICD-10-CM | POA: Insufficient documentation

## 2016-02-24 LAB — BASIC METABOLIC PANEL
ANION GAP: 11 (ref 5–15)
BUN: 22 mg/dL — ABNORMAL HIGH (ref 6–20)
CALCIUM: 9 mg/dL (ref 8.9–10.3)
CHLORIDE: 107 mmol/L (ref 101–111)
CO2: 23 mmol/L (ref 22–32)
Creatinine, Ser: 0.95 mg/dL (ref 0.61–1.24)
GFR calc Af Amer: >60 mL/min (ref >60)
GFR calc non Af Amer: >60 mL/min (ref >60)
GLUCOSE: 91 mg/dL (ref 65–99)
POTASSIUM: 3.6 mmol/L (ref 3.5–5.1)
Sodium: 141 mmol/L (ref 135–145)

## 2016-02-24 LAB — URINALYSIS, ROUTINE W REFLEX MICROSCOPIC
Bilirubin Urine: NEGATIVE
GLUCOSE, UA: NEGATIVE mg/dL
Hgb urine dipstick: NEGATIVE
Ketones, ur: NEGATIVE mg/dL
LEUKOCYTES UA: NEGATIVE
Nitrite: NEGATIVE
PH: 5 (ref 5.0–8.0)
Protein, ur: NEGATIVE mg/dL
SPECIFIC GRAVITY, URINE: 1.03 (ref 1.005–1.030)

## 2016-02-24 LAB — CBC WITH DIFFERENTIAL/PLATELET
BASOS ABS: 0 10*3/uL (ref 0.0–0.1)
Basophils Relative: 0 %
EOS ABS: 0.3 10*3/uL (ref 0.0–0.7)
EOS PCT: 4 %
HEMATOCRIT: 38.6 % — AB (ref 39.0–52.0)
Hemoglobin: 12.7 g/dL — ABNORMAL LOW (ref 13.0–17.0)
LYMPHS ABS: 2.3 10*3/uL (ref 0.7–4.0)
Lymphocytes Relative: 30 %
MCH: 27.7 pg (ref 26.0–34.0)
MCHC: 32.9 g/dL (ref 30.0–36.0)
MCV: 84.3 fL (ref 78.0–100.0)
MONO ABS: 0.6 10*3/uL (ref 0.1–1.0)
MONOS PCT: 7 %
NEUTROS PCT: 59 %
Neutro Abs: 4.5 10*3/uL (ref 1.7–7.7)
Platelets: 217 10*3/uL (ref 150–400)
RBC: 4.58 MIL/uL (ref 4.22–5.81)
RDW: 14.7 % (ref 11.5–15.5)
WBC: 7.6 10*3/uL (ref 4.0–10.5)

## 2016-02-24 MED ORDER — ONDANSETRON HCL 4 MG/2ML IJ SOLN
4.0000 mg | Freq: Once | INTRAMUSCULAR | Status: AC
  Administered 2016-02-24: 4 mg via INTRAVENOUS
  Filled 2016-02-24: qty 2

## 2016-02-24 MED ORDER — CYCLOBENZAPRINE HCL 10 MG PO TABS: 5.0000 mg | ORAL_TABLET | Freq: Two times a day (BID) | ORAL | Status: DC | PRN

## 2016-02-24 MED ORDER — IOPAMIDOL (ISOVUE-300) INJECTION 61%
INTRAVENOUS | Status: AC
  Administered 2016-02-24: 100 mL
  Filled 2016-02-24: qty 100

## 2016-02-24 MED ORDER — MORPHINE SULFATE (PF) 4 MG/ML IV SOLN
4.0000 mg | Freq: Once | INTRAVENOUS | Status: AC
  Administered 2016-02-24: 4 mg via INTRAVENOUS
  Filled 2016-02-24: qty 1

## 2016-02-24 MED ORDER — LIDOCAINE-EPINEPHRINE-TETRACAINE (LET) SOLUTION
3.0000 mL | Freq: Once | NASAL | Status: AC
  Administered 2016-02-24: 3 mL via TOPICAL
  Filled 2016-02-24: qty 3

## 2016-02-24 MED ORDER — SODIUM CHLORIDE 0.9 % IV BOLUS (SEPSIS)
1000.0000 mL | Freq: Once | INTRAVENOUS | Status: AC
  Administered 2016-02-24: 1000 mL via INTRAVENOUS
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
## 2016-02-24 NOTE — ED Notes (Signed)
Pt complaining of R knee and R hand pain. Small laceration to R knee. Bleeding controlled.
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
## 2016-02-24 NOTE — ED Notes (Signed)
Patient taken to CT.
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
## 2016-02-24 NOTE — ED Notes (Signed)
Dr Wyatt at bedside.  
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
## 2016-03-28 ENCOUNTER — Other Ambulatory Visit: Payer: Self-pay | Admitting: Surgery

## 2016-04-03 ENCOUNTER — Encounter (HOSPITAL_COMMUNITY): Payer: Self-pay | Admitting: *Deleted

## 2016-04-11 ENCOUNTER — Ambulatory Visit (HOSPITAL_COMMUNITY): Payer: Managed Care, Other (non HMO) | Admitting: Anesthesiology

## 2016-04-11 ENCOUNTER — Encounter (HOSPITAL_COMMUNITY)
Admission: RE | Disposition: A | Discharge status: Home / Self Care | Payer: Self-pay | Source: Ambulatory Visit | Attending: Surgery

## 2016-04-11 ENCOUNTER — Ambulatory Visit (HOSPITAL_COMMUNITY)
Admission: RE | Admit: 2016-04-11 | Discharge: 2016-04-11 | Disposition: A | Discharge status: Home / Self Care | Payer: Managed Care, Other (non HMO) | Source: Ambulatory Visit | Attending: Surgery | Admitting: Surgery

## 2016-04-11 ENCOUNTER — Encounter (HOSPITAL_COMMUNITY): Payer: Self-pay

## 2016-04-11 DIAGNOSIS — E669 Obesity, unspecified: Secondary | ICD-10-CM | POA: Diagnosis not present

## 2016-04-11 DIAGNOSIS — Z6831 Body mass index (BMI) 31.0-31.9, adult: Secondary | ICD-10-CM | POA: Diagnosis not present

## 2016-04-11 DIAGNOSIS — Z87891 Personal history of nicotine dependence: Secondary | ICD-10-CM | POA: Diagnosis not present

## 2016-04-11 DIAGNOSIS — K4020 Bilateral inguinal hernia, without obstruction or gangrene, not specified as recurrent: Secondary | ICD-10-CM | POA: Insufficient documentation

## 2016-04-11 DIAGNOSIS — K4090 Unilateral inguinal hernia, without obstruction or gangrene, not specified as recurrent: Secondary | ICD-10-CM | POA: Diagnosis present

## 2016-04-11 HISTORY — PX: INGUINAL HERNIA REPAIR: SHX194

## 2016-04-11 HISTORY — PX: INSERTION OF MESH: SHX5868

## 2016-04-11 HISTORY — DX: Unspecified asthma, uncomplicated: J45.909

## 2016-04-11 LAB — CBC
HCT: 41.2 % (ref 39.0–52.0)
HEMOGLOBIN: 13.5 g/dL (ref 13.0–17.0)
MCH: 27.8 pg (ref 26.0–34.0)
MCHC: 32.8 g/dL (ref 30.0–36.0)
MCV: 84.8 fL (ref 78.0–100.0)
PLATELETS: 235 10*3/uL (ref 150–400)
RBC: 4.86 MIL/uL (ref 4.22–5.81)
RDW: 14.6 % (ref 11.5–15.5)
WBC: 6.6 10*3/uL (ref 4.0–10.5)

## 2016-04-11 SURGERY — REPAIR, HERNIA, INGUINAL, LAPAROSCOPIC
Anesthesia: General | Site: Abdomen | Laterality: Bilateral

## 2016-04-11 MED ORDER — LIDOCAINE HCL (CARDIAC) 20 MG/ML IV SOLN
INTRAVENOUS | Status: DC | PRN
  Administered 2016-04-11: 100 mg via INTRAVENOUS

## 2016-04-11 MED ORDER — SUGAMMADEX SODIUM 200 MG/2ML IV SOLN
INTRAVENOUS | Status: AC
  Filled 2016-04-11: qty 2

## 2016-04-11 MED ORDER — 0.9 % SODIUM CHLORIDE (POUR BTL) OPTIME
TOPICAL | Status: DC | PRN
  Administered 2016-04-11: 1000 mL

## 2016-04-11 MED ORDER — CHLORHEXIDINE GLUCONATE 4 % EX LIQD: 1.0000 "application " | Freq: Once | CUTANEOUS | Status: DC

## 2016-04-11 MED ORDER — CEFAZOLIN SODIUM-DEXTROSE 2-4 GM/100ML-% IV SOLN
INTRAVENOUS | Status: AC
  Filled 2016-04-11: qty 100

## 2016-04-11 MED ORDER — KETOROLAC TROMETHAMINE 30 MG/ML IJ SOLN
INTRAMUSCULAR | Status: DC | PRN
  Administered 2016-04-11: 30 mg via INTRAVENOUS

## 2016-04-11 MED ORDER — PROMETHAZINE HCL 25 MG/ML IJ SOLN: 6.2500 mg | INTRAMUSCULAR | Status: DC | PRN

## 2016-04-11 MED ORDER — MIDAZOLAM HCL 5 MG/5ML IJ SOLN
INTRAMUSCULAR | Status: DC | PRN
  Administered 2016-04-11: 2 mg via INTRAVENOUS

## 2016-04-11 MED ORDER — FENTANYL CITRATE (PF) 100 MCG/2ML IJ SOLN
INTRAMUSCULAR | Status: AC
  Filled 2016-04-11: qty 2

## 2016-04-11 MED ORDER — CEFAZOLIN SODIUM-DEXTROSE 2-4 GM/100ML-% IV SOLN
2.0000 g | INTRAVENOUS | Status: AC
  Administered 2016-04-11: 2 g via INTRAVENOUS

## 2016-04-11 MED ORDER — ONDANSETRON HCL 4 MG/2ML IJ SOLN
INTRAMUSCULAR | Status: DC | PRN
  Administered 2016-04-11: 4 mg via INTRAVENOUS

## 2016-04-11 MED ORDER — LACTATED RINGERS IR SOLN
Status: DC | PRN
  Administered 2016-04-11: 1000 mL

## 2016-04-11 MED ORDER — ONDANSETRON HCL 4 MG/2ML IJ SOLN
INTRAMUSCULAR | Status: AC
  Filled 2016-04-11: qty 2

## 2016-04-11 MED ORDER — BUPIVACAINE-EPINEPHRINE 0.25% -1:200000 IJ SOLN
INTRAMUSCULAR | Status: DC | PRN
  Administered 2016-04-11: 80 mL

## 2016-04-11 MED ORDER — LACTATED RINGERS IV SOLN
INTRAVENOUS | Status: DC
  Administered 2016-04-11 (×2): via INTRAVENOUS

## 2016-04-11 MED ORDER — MIDAZOLAM HCL 2 MG/2ML IJ SOLN
INTRAMUSCULAR | Status: AC
  Filled 2016-04-11: qty 2

## 2016-04-11 MED ORDER — SUCCINYLCHOLINE CHLORIDE 20 MG/ML IJ SOLN
INTRAMUSCULAR | Status: DC | PRN
  Administered 2016-04-11: 100 mg via INTRAVENOUS

## 2016-04-11 MED ORDER — CYCLOBENZAPRINE HCL 10 MG PO TABS: 5.0000 mg | ORAL_TABLET | Freq: Three times a day (TID) | ORAL | Status: DC | PRN

## 2016-04-11 MED ORDER — ROCURONIUM BROMIDE 100 MG/10ML IV SOLN
INTRAVENOUS | Status: DC | PRN
  Administered 2016-04-11: 20 mg via INTRAVENOUS
  Administered 2016-04-11: 50 mg via INTRAVENOUS

## 2016-04-11 MED ORDER — SUGAMMADEX SODIUM 200 MG/2ML IV SOLN
INTRAVENOUS | Status: DC | PRN
  Administered 2016-04-11: 200 mg via INTRAVENOUS

## 2016-04-11 MED ORDER — PROPOFOL 10 MG/ML IV BOLUS
INTRAVENOUS | Status: DC | PRN
  Administered 2016-04-11: 200 mg via INTRAVENOUS

## 2016-04-11 MED ORDER — FENTANYL CITRATE (PF) 100 MCG/2ML IJ SOLN
25.0000 ug | INTRAMUSCULAR | Status: DC | PRN
  Administered 2016-04-11 (×2): 50 ug via INTRAVENOUS

## 2016-04-11 MED ORDER — FENTANYL CITRATE (PF) 250 MCG/5ML IJ SOLN
INTRAMUSCULAR | Status: AC
  Filled 2016-04-11: qty 5

## 2016-04-11 MED ORDER — FENTANYL CITRATE (PF) 100 MCG/2ML IJ SOLN
INTRAMUSCULAR | Status: DC | PRN
  Administered 2016-04-11 (×2): 100 ug via INTRAVENOUS
  Administered 2016-04-11: 50 ug via INTRAVENOUS

## 2016-04-11 MED ORDER — BUPIVACAINE-EPINEPHRINE 0.25% -1:200000 IJ SOLN
INTRAMUSCULAR | Status: AC
  Filled 2016-04-11: qty 2

## 2016-04-11 MED ORDER — OXYCODONE HCL 5 MG PO TABS: 5.0000 mg | ORAL_TABLET | Freq: Four times a day (QID) | ORAL | Status: DC | PRN

## 2016-04-11 MED ORDER — NAPROXEN 500 MG PO TABS: 500.0000 mg | ORAL_TABLET | Freq: Two times a day (BID) | ORAL | Status: DC

## 2016-04-11 SURGICAL SUPPLY — 31 items
CABLE HIGH FREQUENCY MONO STRZ (ELECTRODE) ×3 IMPLANT
CHLORAPREP W/TINT 26ML (MISCELLANEOUS) ×3 IMPLANT
COVER SURGICAL LIGHT HANDLE (MISCELLANEOUS) ×1 IMPLANT
DECANTER SPIKE VIAL GLASS SM (MISCELLANEOUS) ×3 IMPLANT
DEVICE SECURE STRAP 25 ABSORB (INSTRUMENTS) IMPLANT
DRAPE LAPAROSCOPIC ABDOMINAL (DRAPES) ×3 IMPLANT
DRAPE WARM FLUID 44X44 (DRAPE) ×3 IMPLANT
DRSG TEGADERM 2-3/8X2-3/4 SM (GAUZE/BANDAGES/DRESSINGS) ×4 IMPLANT
DRSG TEGADERM 4X4.75 (GAUZE/BANDAGES/DRESSINGS) ×3 IMPLANT
ELECT REM PT RETURN 9FT ADLT (ELECTROSURGICAL) ×3
ELECTRODE REM PT RTRN 9FT ADLT (ELECTROSURGICAL) ×1 IMPLANT
GAUZE SPONGE 2X2 8PLY STRL LF (GAUZE/BANDAGES/DRESSINGS) ×1 IMPLANT
GLOVE ECLIPSE 8.0 STRL XLNG CF (GLOVE) ×3 IMPLANT
GLOVE INDICATOR 8.0 STRL GRN (GLOVE) ×3 IMPLANT
GOWN STRL REUS W/TWL XL LVL3 (GOWN DISPOSABLE) ×10 IMPLANT
KIT BASIN OR (CUSTOM PROCEDURE TRAY) ×3 IMPLANT
MESH ULTRAPRO 6X6 15CM15CM (Mesh General) ×4 IMPLANT
PAD POSITIONING PINK XL (MISCELLANEOUS) ×3 IMPLANT
SCISSORS LAP 5X35 DISP (ENDOMECHANICALS) ×3 IMPLANT
SET IRRIG TUBING LAPAROSCOPIC (IRRIGATION / IRRIGATOR) IMPLANT
SLEEVE ADV FIXATION 5X100MM (TROCAR) ×3 IMPLANT
SPONGE GAUZE 2X2 STER 10/PKG (GAUZE/BANDAGES/DRESSINGS) ×2
SUT MNCRL AB 4-0 PS2 18 (SUTURE) ×3 IMPLANT
SUT VIC AB 3-0 SH 27 (SUTURE)
SUT VIC AB 3-0 SH 27XBRD (SUTURE) IMPLANT
TACKER 5MM HERNIA 3.5CML NAB (ENDOMECHANICALS) IMPLANT
TOWEL OR 17X26 10 PK STRL BLUE (TOWEL DISPOSABLE) ×3 IMPLANT
TRAY LAPAROSCOPIC (CUSTOM PROCEDURE TRAY) ×3 IMPLANT
TROCAR ADV FIXATION 5X100MM (TROCAR) ×3 IMPLANT
TROCAR XCEL BLUNT TIP 100MML (ENDOMECHANICALS) ×3 IMPLANT
TUBING INSUF HEATED (TUBING) ×3 IMPLANT

## 2016-04-11 NOTE — Interval H&P Note (Signed)
History and Physical Interval Note:  04/11/2016 2:13 PM  Adam Armstrong  has presented today for surgery, with the diagnosis of Hernias in left and possible right groins  The various methods of treatment have been discussed with the patient and family. After consideration of risks, benefits and other options for treatment, the patient has consented to  Procedure(s): LAPAROSCOPIC LEFT POSSIBLE RIGHT INGUINAL HERNIA (Left) INSERTION OF MESH (Left) as a surgical intervention .  The patient's history has been reviewed, patient examined, no change in status, stable for surgery.  I have reviewed the patient's chart and labs.  Questions were answered to the patient's satisfaction.     Marjorie Deprey C.

## 2016-04-11 NOTE — Anesthesia Preprocedure Evaluation (Addendum)
Anesthesia Evaluation  Patient identified by MRN, date of birth, ID band Patient awake    Reviewed: Allergy & Precautions, NPO status , Patient's Chart, lab work & pertinent test results  Airway Mallampati: II  TM Distance: >3 FB Neck ROM: Full    Dental  (+) Teeth Intact, Dental Advisory Given   Pulmonary asthma (inhaler use) , former smoker (quit 18 days ago),    Pulmonary exam normal breath sounds clear to auscultation       Cardiovascular Exercise Tolerance: Good negative cardio ROS Normal cardiovascular exam Rhythm:Regular Rate:Normal     Neuro/Psych negative neurological ROS  negative psych ROS   GI/Hepatic negative GI ROS, Neg liver ROS,   Endo/Other  Obesity   Renal/GU negative Renal ROS     Musculoskeletal negative musculoskeletal ROS (+)   Abdominal   Peds negative pediatric ROS (+)  Hematology negative hematology ROS (+)   Anesthesia Other Findings Day of surgery medications reviewed with the patient.  Reproductive/Obstetrics                            Anesthesia Physical Anesthesia Plan  ASA: II  Anesthesia Plan: General   Post-op Pain Management:    Induction: Intravenous  Airway Management Planned: Oral ETT  Additional Equipment:   Intra-op Plan:   Post-operative Plan: Extubation in OR  Informed Consent: I have reviewed the patients History and Physical, chart, labs and discussed the procedure including the risks, benefits and alternatives for the proposed anesthesia with the patient or authorized representative who has indicated his/her understanding and acceptance.   Dental advisory given  Plan Discussed with: CRNA  Anesthesia Plan Comments: (Risks/benefits of general anesthesia discussed with patient including risk of damage to teeth, lips, gum, and tongue, nausea/vomiting, allergic reactions to medications, and the possibility of heart attack, stroke and  death.  All patient questions answered.  Patient wishes to proceed.)        Anesthesia Quick Evaluation

## 2016-04-11 NOTE — Discharge Instructions (Signed)
HERNIA REPAIR: POST OP INSTRUCTIONS ° °###################################################################### ° °EAT °Gradually transition to a high fiber diet with a fiber supplement over the next few weeks after discharge.  Start with a pureed / full liquid diet (see below) ° °WALK °Walk an hour a day.  Control your pain to do that.   ° °CONTROL PAIN °Control pain so that you can walk, sleep, tolerate sneezing/coughing, go up/down stairs. ° °HAVE A BOWEL MOVEMENT DAILY °Keep your bowels regular to avoid problems.  OK to try a laxative to override constipation.  OK to use an antidairrheal to slow down diarrhea.  Call if not better after 2 tries ° °CALL IF YOU HAVE PROBLEMS/CONCERNS °Call if you are still struggling despite following these instructions. °Call if you have concerns not answered by these instructions ° °###################################################################### ° ° ° °1. DIET: Follow a light bland diet the first 24 hours after arrival home, such as soup, liquids, crackers, etc.  Be sure to include lots of fluids daily.  Avoid fast food or heavy meals as your are more likely to get nauseated.  Eat a low fat the next few days after surgery. °2. Take your usually prescribed home medications unless otherwise directed. °3. PAIN CONTROL: °a. Pain is best controlled by a usual combination of three different methods TOGETHER: °i. Ice/Heat °ii. Over the counter pain medication °iii. Prescription pain medication °b. Most patients will experience some swelling and bruising around the hernia(s) such as the bellybutton, groins, or old incisions.  Ice packs or heating pads (30-60 minutes up to 6 times a day) will help. Use ice for the first few days to help decrease swelling and bruising, then switch to heat to help relax tight/sore spots and speed recovery.  Some people prefer to use ice alone, heat alone, alternating between ice & heat.  Experiment to what works for you.  Swelling and bruising can take  several weeks to resolve.   °c. It is helpful to take an over-the-counter pain medication regularly for the first few weeks.  Choose one of the following that works best for you: °i. Naproxen (Aleve, etc)  Two 220mg tabs twice a day °ii. Ibuprofen (Advil, etc) Three 200mg tabs four times a day (every meal & bedtime) °iii. Acetaminophen (Tylenol, etc) 325-650mg four times a day (every meal & bedtime) °d. A  prescription for pain medication should be given to you upon discharge.  Take your pain medication as prescribed.  °i. If you are having problems/concerns with the prescription medicine (does not control pain, nausea, vomiting, rash, itching, etc), please call us [phone] to see if we need to switch you to a different pain medicine that will work better for you and/or control your side effect better. °ii. If you need a refill on your pain medication, please contact your pharmacy.  They will contact our office to request authorization. Prescriptions will not be filled after 5 pm or on week-ends. °4. Avoid getting constipated.  Between the surgery and the pain medications, it is common to experience some constipation.  Increasing fluid intake and taking a fiber supplement (such as Metamucil, Citrucel, FiberCon, MiraLax, etc) 1-2 times a day regularly will usually help prevent this problem from occurring.  A mild laxative (prune juice, Milk of Magnesia, MiraLax, etc) should be taken according to package directions if there are no bowel movements after 48 hours.   °5. Wash / shower every day.  You may shower over the dressings as they are waterproof.   °6. Remove   your waterproof bandages 5 days after surgery.  You may leave the incision open to air.  You may replace a dressing/Band-Aid to cover the incision for comfort if you wish.  Continue to shower over incision(s) after the dressing is off. ° ° ° °7. ACTIVITIES as tolerated:   °a. You may resume regular (light) daily activities beginning the next day--such  as daily self-care, walking, climbing stairs--gradually increasing activities as tolerated.  If you can walk 30 minutes without difficulty, it is safe to try more intense activity such as jogging, treadmill, bicycling, low-impact aerobics, swimming, etc. °b. Save the most intensive and strenuous activity for last such as sit-ups, heavy lifting, contact sports, etc  Refrain from any heavy lifting or straining until you are off narcotics for pain control.   °c. DO NOT PUSH THROUGH PAIN.  Let pain be your guide: If it hurts to do something, don't do it.  Pain is your body warning you to avoid that activity for another week until the pain goes down. °d. You may drive when you are no longer taking prescription pain medication, you can comfortably wear a seatbelt, and you can safely maneuver your car and apply brakes. °e. You may have sexual intercourse when it is comfortable.  °8. FOLLOW UP in our office °a. Please call [facility] at [phone] to set up an appointment to see your surgeon in the office for a follow-up appointment approximately 2-3 weeks after your surgery. °b. Make sure that you call for this appointment the day you arrive home to insure a convenient appointment time. °9.  IF YOU HAVE DISABILITY OR FAMILY LEAVE FORMS, BRING THEM TO THE OFFICE FOR PROCESSING.  DO NOT GIVE THEM TO YOUR DOCTOR. ° °WHEN TO CALL US [phone]: °1. Poor pain control °2. Reactions / problems with new medications (rash/itching, nausea, etc)  °3. Fever over 101.5 F (38.5 C) °4. Inability to urinate °5. Nausea and/or vomiting °6. Worsening swelling or bruising °7. Continued bleeding from incision. °8. Increased pain, redness, or drainage from the incision ° ° The clinic staff is available to answer your questions during regular business hours (8:30am-5pm).  Please don’t hesitate to call and ask to speak to one of our nurses for clinical concerns.  ° If you have a medical emergency, go to the nearest emergency room or call  911. ° A surgeon from [facility] is always on call at the hospitals in [place] ° °[facility] °[address], [address], [place], [place]  [address] ? ° [address] [address], [place], [place]   [address] °MAIN: [phone] ? TOLL FREE: [phone] ? FAX: [phone] °[email or web address] ° ° °
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
## 2016-04-11 NOTE — Anesthesia Postprocedure Evaluation (Signed)
Anesthesia Post Note  Patient: Adam Armstrong  Procedure(s) Performed: Procedure(s) (LRB): LAPAROSCOPIC LEFT AND  RIGHT INGUINAL HERNIA REPAIRS WITH MESH (Bilateral) INSERTION OF MESH (Bilateral)  Patient location during evaluation: PACU Anesthesia Type: General Level of consciousness: awake and alert Pain management: pain level controlled Vital Signs Assessment: post-procedure vital signs reviewed and stable Respiratory status: spontaneous breathing, nonlabored ventilation, respiratory function stable and patient connected to nasal cannula oxygen Cardiovascular status: blood pressure returned to baseline and stable Postop Assessment: no signs of nausea or vomiting Anesthetic complications: no    Last Vitals:  Filed Vitals:   04/11/16 1649 04/11/16 1656  BP:  132/84  Pulse: 78 72  Temp:  37 C  Resp:      Last Pain:  Filed Vitals:   04/11/16 1702  PainSc: 2                  Cecile HearingStephen Edward Turk

## 2016-04-11 NOTE — Transfer of Care (Signed)
Immediate Anesthesia Transfer of Care Note  Patient: Adam Armstrong  Procedure(s) Performed: Procedure(s): LAPAROSCOPIC LEFT AND  RIGHT INGUINAL HERNIA REPAIRS WITH MESH (Bilateral) INSERTION OF MESH (Bilateral)  Patient Location: PACU  Anesthesia Type:General  Level of Consciousness: awake, alert  and oriented  Airway & Oxygen Therapy: Patient Spontanous Breathing and Patient connected to face mask oxygen  Post-op Assessment: Report given to RN and Post -op Vital signs reviewed and stable  Post vital signs: Reviewed and stable  Last Vitals:  Filed Vitals:   04/11/16 1144  BP: 146/97  Pulse: 75  Temp: 37.3 C  Resp: 18    Last Pain: There were no vitals filed for this visit.    Patients Stated Pain Goal: 4 (04/11/16 1214)  Complications: No apparent anesthesia complications

## 2016-04-11 NOTE — Op Note (Signed)
04/11/2016  4:20 PM  PATIENT:  Adam Armstrong  30 y.o. male  Patient Care Team: Tracey Harriesavid Bouska, MD as PCP - General (Family Medicine) Karie SodaSteven Ramzey Petrovic, MD as Consulting Physician (General Surgery)  PRE-OPERATIVE DIAGNOSIS:  Hernias in left and possible right groins  POST-OPERATIVE DIAGNOSIS:  Hernias in left and possible right groins  PROCEDURE:  Procedure(s): LAPAROSCOPIC LEFT AND  RIGHT INGUINAL HERNIA REPAIRS WITH MESH INSERTION OF MESH  SURGEON:  Surgeon(s): Karie SodaSteven Atalie Oros, MD  ASSISTANT: RNFA   ANESTHESIA:   Regional ilioinguinal and genitofemoral and spermatic cord nerve blocks with GETA  EBL:  Total I/O In: 1000 [I.V.:1000] Out: 25 [Blood:25]  Delay start of Pharmacological VTE agent (>24hrs) due to surgical blood loss or risk of bleeding:  no  DRAINS: NONE  SPECIMEN:  NONE  DISPOSITION OF SPECIMEN:  N/A  COUNTS:  YES  PLAN OF CARE: Discharge to home after PACU  PATIENT DISPOSITION:  PACU - hemodynamically stable.  INDICATION: Pleasant patient was symptomatically table hernia.  More subtle but definite right angle hernias well.  I recommended laparoscopic exploration and repair of hernias found.  The anatomy & physiology of the abdominal wall and pelvic floor was discussed.  The pathophysiology of hernias in the inguinal and pelvic region was discussed.  Natural history risks such as progressive enlargement, pain, incarceration & strangulation was discussed.   Contributors to complications such as smoking, obesity, diabetes, prior surgery, etc were discussed.    I feel the risks of no intervention will lead to serious problems that outweigh the operative risks; therefore, I recommended surgery to reduce and repair the hernia.  I explained laparoscopic techniques with possible need for an open approach.  I noted usual use of mesh to patch and/or buttress hernia repair  Risks such as bleeding, infection, abscess, need for further treatment, heart attack, death, and  other risks were discussed.  I noted a good likelihood this will help address the problem.   Goals of post-operative recovery were discussed as well.  Possibility that this will not correct all symptoms was explained.  I stressed the importance of low-impact activity, aggressive pain control, avoiding constipation, & not pushing through pain to minimize risk of post-operative chronic pain or injury. Possibility of reherniation was discussed.  We will work to minimize complications.     An educational handout further explaining the pathology & treatment options was given as well.  Questions were answered.  The patient expresses understanding & wishes to proceed with surgery.  OR FINDINGS: Patient had bilateral indirect hernias.  The left was quite large with a giant chronic hernia sac.  The right was much more subtle.   No direct space ingunal, femoral, or obturator hernias.  DESCRIPTION:   The patient was identified & brought into the operating room. The patient was positioned supine with arms tucked. SCDs were active during the entire case. The patient underwent general anesthesia without any difficulty.  The abdomen was prepped and draped in a sterile fashion. The patient's bladder was emptied.  A Surgical Timeout confirmed our plan.  I made a transverse incision through the inferior umbilical fold.  I made a small transverse nick through the anterior rectus fascia contralateral to the inguinal hernia side and placed a 0-vicryl stitch through the fascia.  I placed a Hasson trocar into the preperitoneal plane.  Entry was clean.  We induced carbon dioxide insufflation. Camera inspection revealed no injury.  I used a 10mm angled scope to bluntly free the peritoneum off the  infraumbilical anterior abdominal wall.  I created enough of a preperitoneal pocket to place 5mm ports into the right & left mid-abdomen into this preperitoneal cavity.  I focused attention on the left side since that was the dominant  hernia side.   I used blunt & focused sharp dissection to free the peritoneum off the flank and down to the pubic rim.  I freed the anteriolateral bladder wall off the anteriolateral pelvic wall, sparing midline attachments.   I located a swath of peritoneum going into a hernia fascial defect at the internal ring consistent with an indirect inguinal hernia.  I gradually freed the peritoneal hernia sac off safely and reduced it into the preperitoneal space.  This took some time as it was rather thick-walled enlarged.  Also freed off and skeletonized and removed some spermatic cord lipomas.  I freed the peritoneum off the spermatic vessels & vas deferens.  I freed peritoneum off the retroperitoneum along the psoas muscle.    I checked & assured hemostasis.    I turned attention on the opposite side.  I did dissection in a similar, mirror-image fashion. The patient had a and much smaller but definite right indirect inguinal hernia.  Small spermatic cord lipomas 2 removed.       I chose 15x15 cm sheets of ultra-lightweight polypropylene mesh (Ultrapro), one for each side.  I cut a single sigmoid-shaped slit ~6cm from a corner of each mesh.  I placed the meshes into the preperitoneal space & laid them as overlapping diamonds such that at the inferior points, a 6x6 cm corner flap rested in the true anterolateral pelvis, covering the obturator & femoral foramina.   I allowed the bladder to return to the pubis, this helping tuck the corners of the mesh in the anteriolateral pelvis.  The medial corners overlapped each other across midline cephalad to the pubic rim.   This provided >2 inch coverage around the hernia.  Because the defects well covered and not particularly large, I did not place any tacks.  I held the hernia sacs cephalad & evacuated carbon dioxide.  I closed the fascia with absorbable suture.  I closed the skin using 4-0 monocryl stitch.  Sterile dressings were applied. The patient was extubated &  arrived in the PACU in stable condition..  I had discussed postoperative care with the patient in the holding area.   I did discuss operative findings and postoperative goals / instructions to the patient's family as well.  Instructions are written in the chart.  Ardeth SportsmanSteven C. Kylar Speelman, M.D., F.A.C.S. Gastrointestinal and Minimally Invasive Surgery [facility] [address] [place], [place] [address] [phone] Main / Paging

## 2016-04-11 NOTE — Anesthesia Procedure Notes (Signed)
Procedure Name: Intubation Performed by: Romanita Fager J Pre-anesthesia Checklist: Patient identified, Emergency Drugs available, Suction available, Patient being monitored and Timeout performed Patient Re-evaluated:Patient Re-evaluated prior to inductionOxygen Delivery Method: Circle system utilized Preoxygenation: Pre-oxygenation with 100% oxygen Intubation Type: IV induction Ventilation: Mask ventilation without difficulty Laryngoscope Size: Mac and 4 Grade View: Grade I Tube type: Oral Tube size: 7.5 mm Number of attempts: 1 Airway Equipment and Method: Stylet Placement Confirmation: ETT inserted through vocal cords under direct vision,  positive ETCO2,  CO2 detector and breath sounds checked- equal and bilateral Secured at: 23 cm Tube secured with: Tape Dental Injury: Teeth and Oropharynx as per pre-operative assessment        

## 2016-04-14 ENCOUNTER — Encounter (HOSPITAL_COMMUNITY): Payer: Self-pay | Admitting: Surgery

## 2017-01-31 ENCOUNTER — Emergency Department (HOSPITAL_COMMUNITY): Payer: Managed Care, Other (non HMO)

## 2017-01-31 ENCOUNTER — Encounter (HOSPITAL_COMMUNITY): Payer: Self-pay | Admitting: Emergency Medicine

## 2017-01-31 ENCOUNTER — Emergency Department (HOSPITAL_COMMUNITY)
Admission: EM | Admit: 2017-01-31 | Discharge: 2017-02-01 | Disposition: A | Discharge status: Home / Self Care | Payer: Managed Care, Other (non HMO) | Attending: Emergency Medicine | Admitting: Emergency Medicine

## 2017-01-31 DIAGNOSIS — Y929 Unspecified place or not applicable: Secondary | ICD-10-CM | POA: Diagnosis not present

## 2017-01-31 DIAGNOSIS — W260XXA Contact with knife, initial encounter: Secondary | ICD-10-CM | POA: Diagnosis not present

## 2017-01-31 DIAGNOSIS — Y999 Unspecified external cause status: Secondary | ICD-10-CM | POA: Diagnosis not present

## 2017-01-31 DIAGNOSIS — S6992XA Unspecified injury of left wrist, hand and finger(s), initial encounter: Secondary | ICD-10-CM | POA: Diagnosis present

## 2017-01-31 DIAGNOSIS — Z87891 Personal history of nicotine dependence: Secondary | ICD-10-CM | POA: Insufficient documentation

## 2017-01-31 DIAGNOSIS — Y939 Activity, unspecified: Secondary | ICD-10-CM | POA: Insufficient documentation

## 2017-01-31 DIAGNOSIS — S61412A Laceration without foreign body of left hand, initial encounter: Secondary | ICD-10-CM | POA: Insufficient documentation

## 2017-01-31 DIAGNOSIS — Z79899 Other long term (current) drug therapy: Secondary | ICD-10-CM | POA: Insufficient documentation

## 2017-01-31 DIAGNOSIS — J45909 Unspecified asthma, uncomplicated: Secondary | ICD-10-CM | POA: Insufficient documentation

## 2017-01-31 MED ORDER — LIDOCAINE HCL 2 % IJ SOLN
20.0000 mL | Freq: Once | INTRAMUSCULAR | Status: AC
  Administered 2017-02-01: 400 mg
  Filled 2017-01-31: qty 20

## 2017-01-31 MED ORDER — FENTANYL CITRATE (PF) 100 MCG/2ML IJ SOLN: 50.0000 ug | INTRAMUSCULAR | Status: DC | PRN

## 2017-01-31 NOTE — ED Triage Notes (Signed)
Patient arrives with injury to left middle finger. Was attempting to separate two pieces of frozen meat with chef's knife. Blade slipped and pierced his hand just proximal to base of that finger. Movement is currently intact. Sensation is partially impaired in that finger. Small puncture noted to posterior of that hand suggesting that the blade completely penetrated the hand.

## 2017-01-31 NOTE — ED Provider Notes (Signed)
[facility]-EMERGENCY DEPT Provider Note   CSN: [id number] Arrival date & time: 01/31/17  1936   By signing my name below, I, Adam Armstrong, attest that this documentation has been prepared under the direction and in the presence of Adam Crease, MD  Electronically Signed: Clovis Armstrong, ED Scribe. 01/31/17. 11:15 PM.   History   Chief Complaint Chief Complaint  Patient presents with  . Hand Injury   HPI Comments:  Adam Armstrong is a 31 y.o. male who presents to the Emergency Department complaining of acute onset, moderate laceration to the left hand s/p an incident which occurred PTA. Pt states he sustained his injury with a metal knife. Pt also reports subjective numbness of his left 3rd finger. No alleviating factors noted. Pt denies any other associated symptoms. Tetanus status unknown. No other complaints noted at this time.   The history is provided by the patient. No language interpreter was used.    Past Medical History:  Diagnosis Date  . Asthma   . Bronchitis   . Hernia     Patient Active Problem List   Diagnosis Date Noted  . Bilateral inguinal hernia s/p lap repair with mesh 04/11/2016 04/11/2016    Past Surgical History:  Procedure Laterality Date  . INGUINAL HERNIA REPAIR Bilateral 04/11/2016   Procedure: LAPAROSCOPIC LEFT AND  RIGHT INGUINAL HERNIA REPAIRS WITH MESH;  Surgeon: Karie Soda, MD;  Location: [facility];  Service: General;  Laterality: Bilateral;  . INSERTION OF MESH Bilateral 04/11/2016   Procedure: INSERTION OF MESH;  Surgeon: Karie Soda, MD;  Location: [facility];  Service: General;  Laterality: Bilateral;       Home Medications    Prior to Admission medications   Medication Sig Start Date End Date Taking? Authorizing Provider  albuterol (PROVENTIL HFA;VENTOLIN HFA) 108 (90 BASE) MCG/ACT inhaler Inhale 2 puffs into the lungs every 4 (four) hours as needed for wheezing. 06/14/12 04/09/17  Doug Sou, MD  b complex vitamins tablet Take 1  tablet by mouth daily.    Historical Provider, MD  cholecalciferol (VITAMIN D) 1000 units tablet Take 1,000 Units by mouth daily.    Historical Provider, MD  Cyanocobalamin (VITAMIN B-12 PO) Take 1 tablet by mouth daily.    Historical Provider, MD  cyclobenzaprine (FLEXERIL) 10 MG tablet Take 0.5-1 tablets (5-10 mg total) by mouth 3 (three) times daily as needed for muscle spasms. 04/11/16   Karie Soda, MD  naproxen (NAPROSYN) 500 MG tablet Take 1 tablet (500 mg total) by mouth 2 (two) times daily with a meal. 04/11/16   Karie Soda, MD  Omega-3 Fatty Acids (FISH OIL PO) Take 1 capsule by mouth daily.     Historical Provider, MD  oxyCODONE (OXY IR/ROXICODONE) 5 MG immediate release tablet Take 1-2 tablets (5-10 mg total) by mouth every 6 (six) hours as needed for moderate pain, severe pain or breakthrough pain. 04/11/16   Karie Soda, MD  Pyridoxine HCl (VITAMIN B-6 PO) Take 1 tablet by mouth daily.    Historical Provider, MD  vitamin C (ASCORBIC ACID) 500 MG tablet Take 500 mg by mouth daily.    Historical Provider, MD    Family History History reviewed. No pertinent family history.  Social History Social History  Substance Use Topics  . Smoking status: Former Smoker    Years: 8.00    Types: Cigarettes    Quit date: 03/24/2016  . Smokeless tobacco: Never Used  . Alcohol use Yes     Comment: occ  Allergies   Patient has no known allergies.   Review of Systems Review of Systems  Skin: Positive for wound.  Neurological: Positive for numbness (subjective).  All other systems reviewed and are negative.    Physical Exam Updated Vital Signs BP 128/83   Pulse 85   Temp 98.7 F (37.1 C) (Oral)   Resp 17   SpO2 97%   Physical Exam  Constitutional: He is oriented to person, place, and time. He appears well-developed and well-nourished. No distress.  HENT:  Head: Normocephalic and atraumatic.  Right Ear: Hearing normal.  Left Ear: Hearing normal.  Nose: Nose normal.    Mouth/Throat: Oropharynx is clear and moist and mucous membranes are normal.  Eyes: Conjunctivae and EOM are normal. Pupils are equal, round, and reactive to light.  Neck: Normal range of motion. Neck supple.  Cardiovascular: Regular rhythm, S1 normal and S2 normal.  Exam reveals no gallop and no friction rub.   No murmur heard. Pulmonary/Chest: Effort normal and breath sounds normal. No respiratory distress. He exhibits no tenderness.  Abdominal: Soft. Normal appearance and bowel sounds are normal. There is no hepatosplenomegaly. There is no tenderness. There is no rebound, no guarding, no tenderness at McBurney's point and negative Murphy's sign. No hernia.  Musculoskeletal: Normal range of motion.  1.5 cm laceration to the palmar aspect of the left hand just radial to 3rd MCP joint. 0.5 cm laceration on the dorsal aspect of the hand.    Neurological: He is alert and oriented to person, place, and time. He has normal strength. No cranial nerve deficit or sensory deficit. Coordination normal. GCS eye subscore is 4. GCS verbal subscore is 5. GCS motor subscore is 6.   Subjective decrease in sensation to the entire length of 3rd finger. Slightly decreased flexion and extension to painful inhibition.  Skin: Skin is warm, dry and intact. No rash noted. No cyanosis.  Psychiatric: He has a normal mood and affect. His speech is normal and behavior is normal. Thought content normal.  Nursing note and vitals reviewed.      ED Treatments / Results  DIAGNOSTIC STUDIES:  Oxygen Saturation is 99% on RA, normal by my interpretation.    COORDINATION OF CARE:  11:11 PM Discussed treatment plan with pt at bedside and pt agreed to plan.  Labs (all labs ordered are listed, but only abnormal results are displayed) Labs Reviewed - No data to display  EKG  EKG Interpretation None       Radiology Dg Hand Complete Left  Result Date: 01/31/2017 CLINICAL DATA:  Left long finger injury. Laceration.  Initial encounter. EXAM: LEFT HAND - COMPLETE 3+ VIEW COMPARISON:  None. FINDINGS: There is no evidence of fracture or dislocation. There is no evidence of arthropathy or other focal bone abnormality. Soft tissues are unremarkable. IMPRESSION: Negative. Electronically Signed   By: Sebastian Ache M.D.   On: 01/31/2017 20:22    Procedures .Marland KitchenLaceration Repair Date/Time: 02/01/2017 2:01 AM Performed by: Adam Armstrong Authorized by: Adam Armstrong   Consent:    Consent obtained:  Verbal   Consent given by:  Patient Universal protocol:    Procedure explained and questions answered to patient or proxy's satisfaction: yes     Imaging studies available: yes     Site/side marked: yes     Immediately prior to procedure, a time out was called: yes     Patient identity confirmed:  Verbally with patient Anesthesia (see MAR for exact dosages):  Anesthesia method:  Local infiltration   Local anesthetic:  Lidocaine 2% w/o epi Laceration details:    Location:  Hand   Hand location:  L palm Repair type:    Repair type: no repair performed. Exploration:    Hemostasis achieved with:  Direct pressure   Wound exploration: wound explored through full range of motion     Wound exploration comment:  Wound probed and visualize, but due to the depth could not visualize entire depth of the wound   Contaminated: no   Treatment:    Area cleansed with:  Betadine   Amount of cleaning:  Extensive   Irrigation solution:  Sterile saline   Irrigation method:  Syringe   Visualized foreign bodies/material removed: no   Post-procedure details:    Dressing:  Sterile dressing   (including critical care time)  Medications Ordered in ED Medications  lidocaine (XYLOCAINE) 2 % (with pres) injection 400 mg (not administered)  Tdap (BOOSTRIX) injection 0.5 mL (not administered)     Initial Impression / Assessment and Plan / ED Course  I have reviewed the triage vital signs and the nursing  notes.  Pertinent labs & imaging results that were available during my care of the patient were reviewed by me and considered in my medical decision making (see chart for details).     Patient presents to the emergency department for hand injury. Patient reports that he accidentally stabbed himself in the left hand with a knife. Patient has a wound on the palmar aspect of the hand adjacent to the third MCP joint. There is a small wound on the dorsal aspect of the hand opposite this, indicating through and through wound. Patient is able to flex and extend the finger, but there appears to be some painful inhibition. I do not see any clear or obvious tendon deficit. Patient does, however, have significantly decreased sensation on the radial aspect of the third finger, laceration location suspicious for injury of digital nerve.  Wound was anesthetized by local infiltration with 2% lidocaine and then explored. I did not see any obvious tendon injury on visual inspection, but could not completely visualize the depth of the wound. Wound was extensively irrigated with normal saline solution.  Case was discussed with Dr. Janee Morn, on-call for hand surgery. He did not recommend performing any repair at this time. Will apply bulky dressing and patient will be seen in the office for follow-up. Patient will be prescribed antibiotic and analgesia.  Final Clinical Impressions(s) / ED Diagnoses   Final diagnoses:  Laceration of left hand without foreign body, initial encounter    New Prescriptions New Prescriptions   No medications on file  I personally performed the services described in this documentation, which was scribed in my presence. The recorded information has been reviewed and is accurate.     Adam Crease, MD 02/01/17 [phone]

## 2017-02-01 MED ORDER — OXYCODONE-ACETAMINOPHEN 5-325 MG PO TABS
1.0000 | ORAL_TABLET | Freq: Once | ORAL | Status: AC
  Administered 2017-02-01: 1 via ORAL
  Filled 2017-02-01: qty 1

## 2017-02-01 MED ORDER — DOXYCYCLINE HYCLATE 100 MG PO CAPS: 100.0000 mg | ORAL_CAPSULE | Freq: Two times a day (BID) | ORAL | 0 refills | Status: DC

## 2017-02-01 MED ORDER — TRAMADOL HCL 50 MG PO TABS: 50.0000 mg | ORAL_TABLET | Freq: Four times a day (QID) | ORAL | 0 refills | Status: DC | PRN

## 2017-02-01 MED ORDER — TETANUS-DIPHTH-ACELL PERTUSSIS 5-2.5-18.5 LF-MCG/0.5 IM SUSP
0.5000 mL | Freq: Once | INTRAMUSCULAR | Status: AC
  Administered 2017-02-01: 0.5 mL via INTRAMUSCULAR
  Filled 2017-02-01: qty 0.5

## 2017-02-01 NOTE — ED Notes (Signed)
Repaged Hand Surgery for Dr. Blinda Leatherwood

## 2018-04-23 ENCOUNTER — Other Ambulatory Visit: Payer: Self-pay

## 2018-04-23 ENCOUNTER — Emergency Department (HOSPITAL_COMMUNITY)
Admission: EM | Admit: 2018-04-23 | Discharge: 2018-04-23 | Disposition: A | Discharge status: Home / Self Care | Payer: Managed Care, Other (non HMO) | Attending: Emergency Medicine | Admitting: Emergency Medicine

## 2018-04-23 ENCOUNTER — Encounter (HOSPITAL_COMMUNITY): Payer: Self-pay

## 2018-04-23 DIAGNOSIS — Z87891 Personal history of nicotine dependence: Secondary | ICD-10-CM | POA: Diagnosis not present

## 2018-04-23 DIAGNOSIS — J45909 Unspecified asthma, uncomplicated: Secondary | ICD-10-CM | POA: Insufficient documentation

## 2018-04-23 DIAGNOSIS — R1030 Lower abdominal pain, unspecified: Secondary | ICD-10-CM | POA: Diagnosis present

## 2018-04-23 DIAGNOSIS — Z79899 Other long term (current) drug therapy: Secondary | ICD-10-CM | POA: Diagnosis not present

## 2018-04-23 DIAGNOSIS — R300 Dysuria: Secondary | ICD-10-CM | POA: Insufficient documentation

## 2018-04-23 LAB — URINALYSIS, ROUTINE W REFLEX MICROSCOPIC
Bilirubin Urine: NEGATIVE
GLUCOSE, UA: NEGATIVE mg/dL
Hgb urine dipstick: NEGATIVE
KETONES UR: 5 mg/dL — AB
LEUKOCYTES UA: NEGATIVE
NITRITE: NEGATIVE
PROTEIN: NEGATIVE mg/dL
Specific Gravity, Urine: 1.033 — ABNORMAL HIGH (ref 1.005–1.030)
pH: 5 (ref 5.0–8.0)

## 2018-04-23 NOTE — Discharge Instructions (Signed)
Please follow up with Dr. Michaell CowingGross Return if worsening

## 2018-04-23 NOTE — ED Triage Notes (Signed)
Pt endorses groin pain that began earlier this afternoon while urinating. Has hx of bilateral hernia surgery. Denies testicle pain. Describes pain as pressure in groin area.VSS

## 2018-04-23 NOTE — ED Provider Notes (Signed)
[facility] Provider Note   CSN: [id number] Arrival date & time: 04/23/18  1631     History   Chief Complaint Chief Complaint  Patient presents with  . Groin Pain    HPI Adam Armstrong is a 32 y.o. male who presents with bilateral groin pain.  Past surgical history significant for bilateral inguinal hernia repair by Dr. gross in 2017.  He states that he is intermittently had sharp pains in the area however sensation has never lasted.  Today he went to urinate and had difficulty and started straining.  After this he started to have bilateral pain over the inguinal area.  Lying down makes it better.  Standing up makes it worse.  When he walks the pain becomes worse.  Denies any testicular or abdominal pain.  He is questioning whether the pain could be due to gas, stress, or dehydration.  He states the pain is much better since he is been in the ED.  HPI  Past Medical History:  Diagnosis Date  . Asthma   . Bronchitis   . Hernia     Patient Active Problem List   Diagnosis Date Noted  . Bilateral inguinal hernia s/p lap repair with mesh 04/11/2016 04/11/2016    Past Surgical History:  Procedure Laterality Date  . INGUINAL HERNIA REPAIR Bilateral 04/11/2016   Procedure: LAPAROSCOPIC LEFT AND  RIGHT INGUINAL HERNIA REPAIRS WITH MESH;  Surgeon: Karie SodaSteven Gross, MD;  Location: [facility];  Service: General;  Laterality: Bilateral;  . INSERTION OF MESH Bilateral 04/11/2016   Procedure: INSERTION OF MESH;  Surgeon: Karie SodaSteven Gross, MD;  Location: [facility];  Service: General;  Laterality: Bilateral;        Home Medications    Prior to Admission medications   Medication Sig Start Date End Date Taking? Authorizing Provider  albuterol (PROVENTIL HFA;VENTOLIN HFA) 108 (90 BASE) MCG/ACT inhaler Inhale 2 puffs into the lungs every 4 (four) hours as needed for wheezing. 06/14/12 04/09/17  Doug SouJacubowitz, Sam, MD  b complex vitamins tablet Take 1 tablet by mouth daily.     [provider]  cholecalciferol (VITAMIN D) 1000 units tablet Take 1,000 Units by mouth daily.    [provider]  Cyanocobalamin (VITAMIN B-12 PO) Take 1 tablet by mouth daily.    [provider]  cyclobenzaprine (FLEXERIL) 10 MG tablet Take 0.5-1 tablets (5-10 mg total) by mouth 3 (three) times daily as needed for muscle spasms. 04/11/16   Karie SodaGross, Steven, MD  doxycycline (VIBRAMYCIN) 100 MG capsule Take 1 capsule (100 mg total) by mouth 2 (two) times daily. 02/01/17   Gilda CreasePollina, Christopher J, MD  naproxen (NAPROSYN) 500 MG tablet Take 1 tablet (500 mg total) by mouth 2 (two) times daily with a meal. 04/11/16   Karie SodaGross, Steven, MD  Omega-3 Fatty Acids (FISH OIL PO) Take 1 capsule by mouth daily.     [provider]  oxyCODONE (OXY IR/ROXICODONE) 5 MG immediate release tablet Take 1-2 tablets (5-10 mg total) by mouth every 6 (six) hours as needed for moderate pain, severe pain or breakthrough pain. 04/11/16   Karie SodaGross, Steven, MD  Pyridoxine HCl (VITAMIN B-6 PO) Take 1 tablet by mouth daily.    [provider]  traMADol (ULTRAM) 50 MG tablet Take 1 tablet (50 mg total) by mouth every 6 (six) hours as needed. 02/01/17   Gilda CreasePollina, Christopher J, MD  vitamin C (ASCORBIC ACID) 500 MG tablet Take 500 mg by mouth daily.  [provider]    Family History History reviewed. No pertinent family history.  Social History Social History   Tobacco Use  . Smoking status: Former Smoker    Years: 8.00    Types: Cigarettes    Last attempt to quit: 03/24/2016    Years since quitting: 2.0  . Smokeless tobacco: Never Used  Substance Use Topics  . Alcohol use: Yes    Comment: occ  . Drug use: No     Allergies   Patient has no known allergies.   Review of Systems Review of Systems  Constitutional: Negative for fever.  Gastrointestinal: Negative for abdominal pain and vomiting.  Genitourinary: Positive for difficulty urinating. Negative for discharge,  penile pain and testicular pain.       +groin pain     Physical Exam Updated Vital Signs BP (!) 127/98 (BP Location: Right Arm)   Pulse 80   Temp 98.4 F (36.9 C) (Oral)   Resp 16   Ht 5\' 10"  (1.778 m)   Wt 94.3 kg (208 lb)   SpO2 100%   BMI 29.84 kg/m   Physical Exam  Constitutional: He is oriented to person, place, and time. He appears well-developed and well-nourished. No distress.  Calm and cooperative  HENT:  Head: Normocephalic and atraumatic.  Eyes: Pupils are equal, round, and reactive to light. Conjunctivae are normal. Right eye exhibits no discharge. Left eye exhibits no discharge. No scleral icterus.  Neck: Normal range of motion.  Cardiovascular: Normal rate.  Pulmonary/Chest: Effort normal. No respiratory distress.  Abdominal: He exhibits no distension.  Genitourinary:  Genitourinary Comments: No inguinal lymphadenopathy or inguinal hernia noted. Normal circumcised penis free of lesions or rash. Testicles are nontender with normal lie. Normal scrotal appearance. There is discharge from the meatus which appears consistent with urine. Chaperone Marylene Land, RN) present during exam.    Neurological: He is alert and oriented to person, place, and time.  Skin: Skin is warm and dry.  Psychiatric: He has a normal mood and affect. His behavior is normal.  Nursing note and vitals reviewed.    ED Treatments / Results  Labs (all labs ordered are listed, but only abnormal results are displayed) Labs Reviewed  URINALYSIS, ROUTINE W REFLEX MICROSCOPIC - Abnormal; Notable for the following components:      Result Value   Specific Gravity, Urine 1.033 (*)    Ketones, ur 5 (*)    All other components within normal limits  GC/CHLAMYDIA PROBE AMP ([place]) NOT AT [facility]    EKG None  Radiology No results found.  Procedures Procedures (including critical care time)  Medications Ordered in ED Medications - No data to display   Initial Impression / Assessment and  Plan / ED Course  I have reviewed the triage vital signs and the nursing notes.  Pertinent labs & imaging results that were available during my care of the patient were reviewed by me and considered in my medical decision making (see chart for details).  32 year old male presents with bilateral groin pain after straining to urinate.  He is hypertensive but otherwise vital signs are normal.  He is not in any acute distress.  He does not have an obvious hernia on exam.  He is not does not have any testicular tenderness.  His urine does not look infected.  The patient is requesting discharge.  I advised him that he should follow-up with his surgeon and return for worsening symptoms.  He verbalized understanding.  Final  Clinical Impressions(s) / ED Diagnoses   Final diagnoses:  Inguinal pain, unspecified laterality    ED Discharge Orders    None       Bethel Born, PA-C 04/23/18 2233    Melene Plan, DO 04/24/18 1211

## 2018-04-23 NOTE — ED Provider Notes (Signed)
Patient placed in Quick Look pathway, seen and evaluated   Chief Complaint: Bilateral inguinal pain  HPI:   Patient has history of bilateral inguinal hernia repair.  He has pressure, denies testicular pain.  This worsened while he was urinating.  Denies any penile discharge.   He reports that he does not feel a bulge in the area of his hernias.  He reports that it hurts the same on each side.    ROS: No trauma.  No fevers  Physical Exam:   Gen: No distress  Neuro: Awake and Alert  Skin: Warm    Focused Exam:  Abd soft, non tender, non distended.     Initiation of care has begun. The patient has been counseled on the process, plan, and necessity for staying for the completion/evaluation, and the remainder of the medical screening examination    Norman ClayHammond, Oreste Majeed W, PA-C 04/23/18 1701    Charlynne PanderYao, David Hsienta, MD 04/23/18 2350

## 2018-04-23 NOTE — ED Notes (Signed)
Patient verbalizes understanding of discharge instructions. Opportunity for questioning and answers were provided. Armband removed by staff, pt discharged from ED ambulatory.   

## 2018-04-24 LAB — GC/CHLAMYDIA PROBE AMP (~~LOC~~) NOT AT ARMC
CHLAMYDIA, DNA PROBE: NEGATIVE
Neisseria Gonorrhea: NEGATIVE

## 2018-10-14 ENCOUNTER — Encounter (HOSPITAL_COMMUNITY): Payer: Self-pay | Admitting: *Deleted

## 2018-10-14 ENCOUNTER — Emergency Department (HOSPITAL_COMMUNITY)
Admission: EM | Admit: 2018-10-14 | Discharge: 2018-10-14 | Disposition: A | Discharge status: Home / Self Care | Payer: Managed Care, Other (non HMO) | Attending: Emergency Medicine | Admitting: Emergency Medicine

## 2018-10-14 DIAGNOSIS — M79674 Pain in right toe(s): Secondary | ICD-10-CM | POA: Diagnosis present

## 2018-10-14 DIAGNOSIS — Z79899 Other long term (current) drug therapy: Secondary | ICD-10-CM | POA: Diagnosis not present

## 2018-10-14 DIAGNOSIS — Z87891 Personal history of nicotine dependence: Secondary | ICD-10-CM | POA: Insufficient documentation

## 2018-10-14 DIAGNOSIS — L03031 Cellulitis of right toe: Secondary | ICD-10-CM | POA: Insufficient documentation

## 2018-10-14 DIAGNOSIS — J45909 Unspecified asthma, uncomplicated: Secondary | ICD-10-CM | POA: Insufficient documentation

## 2018-10-14 MED ORDER — AMOXICILLIN-POT CLAVULANATE 875-125 MG PO TABS: 1.0000 | ORAL_TABLET | Freq: Two times a day (BID) | ORAL | 0 refills | Status: DC

## 2018-10-14 NOTE — ED Notes (Signed)
Patient verbalizes understanding of discharge instructions. Opportunity for questioning and answers were provided. Armband removed by staff, pt discharged from ED ambulatory.   

## 2018-10-14 NOTE — ED Triage Notes (Addendum)
Pt here with pain and swelling in right great toe.  Pain has been increasing over the past week.  No fever or chills.

## 2018-10-14 NOTE — Discharge Instructions (Signed)
Take the prescribed medication as directed.  Soak foot or use warm compresses at least twice daily. Follow-up with your primary care doctor. Return to the ED for new or worsening symptoms.

## 2018-10-14 NOTE — ED Provider Notes (Signed)
[facility] Provider Note   CSN: [id number] Arrival date & time: 10/14/18  1855     History   Chief Complaint Chief Complaint  Patient presents with  . Toe Pain    paronychia of right great toe    HPI Adam Armstrong is a 33 y.o. male.  The history is provided by the patient and medical records.  Toe Pain      33 y.o. M with hx of asthma, bronchitis, hernia, presenting to the ED for infected right great toe.  States he noticed this a few days ago, seems to be getting worse.  Denies fevers, numbness, tingling, or difficulty walking.  He is not diabetic.  No hx of foot infections in the past.  No intervention tried PTA.  Past Medical History:  Diagnosis Date  . Asthma   . Bronchitis   . Hernia     Patient Active Problem List   Diagnosis Date Noted  . Bilateral inguinal hernia s/p lap repair with mesh 04/11/2016 04/11/2016    Past Surgical History:  Procedure Laterality Date  . INGUINAL HERNIA REPAIR Bilateral 04/11/2016   Procedure: LAPAROSCOPIC LEFT AND  RIGHT INGUINAL HERNIA REPAIRS WITH MESH;  Surgeon: Karie SodaSteven Gross, MD;  Location: [facility];  Service: General;  Laterality: Bilateral;  . INSERTION OF MESH Bilateral 04/11/2016   Procedure: INSERTION OF MESH;  Surgeon: Karie SodaSteven Gross, MD;  Location: [facility];  Service: General;  Laterality: Bilateral;        Home Medications    Prior to Admission medications   Medication Sig Start Date End Date Taking? Authorizing Provider  albuterol (PROVENTIL HFA;VENTOLIN HFA) 108 (90 BASE) MCG/ACT inhaler Inhale 2 puffs into the lungs every 4 (four) hours as needed for wheezing. 06/14/12 04/09/17  Doug SouJacubowitz, Sam, MD  b complex vitamins tablet Take 1 tablet by mouth daily.    [provider]  cholecalciferol (VITAMIN D) 1000 units tablet Take 1,000 Units by mouth daily.    [provider]  Cyanocobalamin (VITAMIN B-12 PO) Take 1 tablet by mouth daily.    [provider]    cyclobenzaprine (FLEXERIL) 10 MG tablet Take 0.5-1 tablets (5-10 mg total) by mouth 3 (three) times daily as needed for muscle spasms. 04/11/16   Karie SodaGross, Steven, MD  doxycycline (VIBRAMYCIN) 100 MG capsule Take 1 capsule (100 mg total) by mouth 2 (two) times daily. 02/01/17   Gilda CreasePollina, Christopher J, MD  naproxen (NAPROSYN) 500 MG tablet Take 1 tablet (500 mg total) by mouth 2 (two) times daily with a meal. 04/11/16   Karie SodaGross, Steven, MD  Omega-3 Fatty Acids (FISH OIL PO) Take 1 capsule by mouth daily.     [provider]  oxyCODONE (OXY IR/ROXICODONE) 5 MG immediate release tablet Take 1-2 tablets (5-10 mg total) by mouth every 6 (six) hours as needed for moderate pain, severe pain or breakthrough pain. 04/11/16   Karie SodaGross, Steven, MD  Pyridoxine HCl (VITAMIN B-6 PO) Take 1 tablet by mouth daily.    [provider]  traMADol (ULTRAM) 50 MG tablet Take 1 tablet (50 mg total) by mouth every 6 (six) hours as needed. 02/01/17   Gilda CreasePollina, Christopher J, MD  vitamin C (ASCORBIC ACID) 500 MG tablet Take 500 mg by mouth daily.    [provider]    Family History No family history on file.  Social History Social History   Tobacco Use  . Smoking status: Former Smoker    Years: 8.00  Types: Cigarettes    Last attempt to quit: 03/24/2016    Years since quitting: 2.5  . Smokeless tobacco: Never Used  Substance Use Topics  . Alcohol use: Yes    Comment: occ  . Drug use: No     Allergies   Patient has no known allergies.   Review of Systems Review of Systems  Musculoskeletal: Positive for arthralgias.  All other systems reviewed and are negative.    Physical Exam Updated Vital Signs BP (!) 146/106 (BP Location: Right Arm)   Pulse 82   Temp 98.2 F (36.8 C) (Oral)   Resp 16   Ht 5\' 10"  (1.778 m)   Wt 93 kg   SpO2 98%   BMI 29.41 kg/m   Physical Exam Vitals signs and nursing note reviewed.  Constitutional:      Appearance: He is well-developed.  HENT:      Head: Normocephalic and atraumatic.  Eyes:     Conjunctiva/sclera: Conjunctivae normal.     Pupils: Pupils are equal, round, and reactive to light.  Neck:     Musculoskeletal: Normal range of motion.  Cardiovascular:     Rate and Rhythm: Normal rate and regular rhythm.     Heart sounds: Normal heart sounds.  Pulmonary:     Effort: Pulmonary effort is normal.     Breath sounds: Normal breath sounds.  Abdominal:     General: Bowel sounds are normal.     Palpations: Abdomen is soft.  Musculoskeletal: Normal range of motion.  Feet:     Comments: Paronychia of right great toe, fluctuance noted without active drainage; no other wounds/ulcers of the foot Skin:    General: Skin is warm and dry.  Neurological:     Mental Status: He is alert and oriented to person, place, and time.      ED Treatments / Results  Labs (all labs ordered are listed, but only abnormal results are displayed) Labs Reviewed - No data to display  EKG None  Radiology No results found.  Procedures Drain paronychia Date/Time: 10/14/2018 10:28 PM Performed by: Garlon HatchetSanders, Darrion Macaulay M, PA-C Authorized by: Garlon HatchetSanders, Ruhi Kopke M, PA-C  Consent: Verbal consent obtained. Risks and benefits: risks, benefits and alternatives were discussed Consent given by: patient Patient understanding: patient states understanding of the procedure being performed Required items: required blood products, implants, devices, and special equipment available Local anesthesia used: no  Anesthesia: Local anesthesia used: no  Sedation: Patient sedated: no  Patient tolerance: Patient tolerated the procedure well with no immediate complications    (including critical care time)  Medications Ordered in ED Medications - No data to display   Initial Impression / Assessment and Plan / ED Course  I have reviewed the triage vital signs and the nursing notes.  Pertinent labs & imaging results that were available during my care of the patient  were reviewed by me and considered in my medical decision making (see chart for details).  33 year old male here with concern of infection of right great toe.  On exam he has uncomplicated paronychia of affected toe.  I&D performed with adequate drainage.  Encourage warm soaks, will start on antibiotics.  Can follow-up with PCP.  Return here for any new or worsening symptoms.  Final Clinical Impressions(s) / ED Diagnoses   Final diagnoses:  Paronychia of great toe of right foot    ED Discharge Orders         Ordered    amoxicillin-clavulanate (AUGMENTIN) 875-125 MG tablet  Every  12 hours     10/14/18 2230           Garlon Hatchet, PA-C 10/14/18 2232    Charlynne Pander, MD 10/15/18 2215

## 2019-01-05 ENCOUNTER — Emergency Department (HOSPITAL_COMMUNITY)
Admission: EM | Admit: 2019-01-05 | Discharge: 2019-01-05 | Disposition: A | Discharge status: Home / Self Care | Payer: Managed Care, Other (non HMO) | Attending: Emergency Medicine | Admitting: Emergency Medicine

## 2019-01-05 ENCOUNTER — Emergency Department (HOSPITAL_COMMUNITY): Payer: Managed Care, Other (non HMO)

## 2019-01-05 ENCOUNTER — Other Ambulatory Visit: Payer: Self-pay

## 2019-01-05 DIAGNOSIS — Z79899 Other long term (current) drug therapy: Secondary | ICD-10-CM | POA: Diagnosis not present

## 2019-01-05 DIAGNOSIS — R112 Nausea with vomiting, unspecified: Secondary | ICD-10-CM | POA: Diagnosis not present

## 2019-01-05 DIAGNOSIS — J45909 Unspecified asthma, uncomplicated: Secondary | ICD-10-CM | POA: Diagnosis not present

## 2019-01-05 DIAGNOSIS — Z87891 Personal history of nicotine dependence: Secondary | ICD-10-CM | POA: Insufficient documentation

## 2019-01-05 DIAGNOSIS — E860 Dehydration: Secondary | ICD-10-CM | POA: Diagnosis not present

## 2019-01-05 DIAGNOSIS — R197 Diarrhea, unspecified: Secondary | ICD-10-CM | POA: Diagnosis not present

## 2019-01-05 MED ORDER — LACTATED RINGERS IV BOLUS: 1000.0000 mL | Freq: Once | INTRAVENOUS | Status: DC

## 2019-01-05 NOTE — ED Provider Notes (Signed)
[facility] EMERGENCY DEPARTMENT Provider Note   CSN: [id number] Arrival date & time: 01/05/19  1619    History   Chief Complaint Chief Complaint  Patient presents with  . Shortness of Breath    HPI Adam Armstrong is a 33 y.o. male.     Patient is is presenting today because on Sunday he had vomiting anytime continue diarrhea.  On Sunday he also had a fever but he has had a fever no other days.  He has not had significant congestion or cough.  However he does note he feels mildly winded when he gets up to walk around and talk on the phone.  He states he has not had much of an appetite so he has not been eating really since Sunday.  He has no localized abdominal pain but states occasionally his back will hurt after the episodes of vomiting had occurred.  He denies any urinary complaints at this time.  He works as a Production designer, theatre/television/film at [employer] and they are not allowing him to return to work unless he has a note that states otherwise.  He denies any wheezing and states this does not feel like his asthma.  He denies chest pain, sore throat or hematemesis.  No bloody stool.  His ex-wife had similar symptoms 2 days before he developed symptoms.  The history is provided by the patient.  Shortness of Breath    Past Medical History:  Diagnosis Date  . Asthma   . Bronchitis   . Hernia     Patient Active Problem List   Diagnosis Date Noted  . Bilateral inguinal hernia s/p lap repair with mesh 04/11/2016 04/11/2016    Past Surgical History:  Procedure Laterality Date  . INGUINAL HERNIA REPAIR Bilateral 04/11/2016   Procedure: LAPAROSCOPIC LEFT AND  RIGHT INGUINAL HERNIA REPAIRS WITH MESH;  Surgeon: Karie Soda, MD;  Location: [facility];  Service: General;  Laterality: Bilateral;  . INSERTION OF MESH Bilateral 04/11/2016   Procedure: INSERTION OF MESH;  Surgeon: Karie Soda, MD;  Location: [facility];  Service: General;  Laterality: Bilateral;        Home Medications     Prior to Admission medications   Medication Sig Start Date End Date Taking? Authorizing Provider  albuterol (PROVENTIL HFA;VENTOLIN HFA) 108 (90 BASE) MCG/ACT inhaler Inhale 2 puffs into the lungs every 4 (four) hours as needed for wheezing. 06/14/12 04/09/17  Doug Sou, MD  amoxicillin-clavulanate (AUGMENTIN) 875-125 MG tablet Take 1 tablet by mouth every 12 (twelve) hours. 10/14/18   Garlon Hatchet, PA-C  b complex vitamins tablet Take 1 tablet by mouth daily.    [provider]  cholecalciferol (VITAMIN D) 1000 units tablet Take 1,000 Units by mouth daily.    [provider]  Cyanocobalamin (VITAMIN B-12 PO) Take 1 tablet by mouth daily.    [provider]  cyclobenzaprine (FLEXERIL) 10 MG tablet Take 0.5-1 tablets (5-10 mg total) by mouth 3 (three) times daily as needed for muscle spasms. 04/11/16   Karie Soda, MD  doxycycline (VIBRAMYCIN) 100 MG capsule Take 1 capsule (100 mg total) by mouth 2 (two) times daily. 02/01/17   [name], MD  naproxen (NAPROSYN) 500 MG tablet Take 1 tablet (500 mg total) by mouth 2 (two) times daily with a meal. 04/11/16   Karie Soda, MD  Omega-3 Fatty Acids (FISH OIL PO) Take 1 capsule by mouth daily.     [provider]  oxyCODONE (OXY IR/ROXICODONE)  5 MG immediate release tablet Take 1-2 tablets (5-10 mg total) by mouth every 6 (six) hours as needed for moderate pain, severe pain or breakthrough pain. 04/11/16   Karie Soda, MD  Pyridoxine HCl (VITAMIN B-6 PO) Take 1 tablet by mouth daily.    [provider]  traMADol (ULTRAM) 50 MG tablet Take 1 tablet (50 mg total) by mouth every 6 (six) hours as needed. 02/01/17   [name], MD  vitamin C (ASCORBIC ACID) 500 MG tablet Take 500 mg by mouth daily.    [provider]    Family History No family history on file.  Social History Social History   Tobacco Use  . Smoking status: Former Smoker    Years: 8.00    Types:  Cigarettes    Last attempt to quit: 03/24/2016    Years since quitting: 2.7  . Smokeless tobacco: Never Used  Substance Use Topics  . Alcohol use: Yes    Comment: occ  . Drug use: No     Allergies   Patient has no known allergies.   Review of Systems Review of Systems  Respiratory: Positive for shortness of breath.   All other systems reviewed and are negative.    Physical Exam Updated Vital Signs BP (!) 151/107 (BP Location: Left Arm)   Pulse 92   Temp 98.7 F (37.1 C) (Oral)   Resp 18   SpO2 98%   Physical Exam Vitals signs and nursing note reviewed.  Constitutional:      General: He is not in acute distress.    Appearance: He is well-developed.  HENT:     Head: Normocephalic and atraumatic.     Mouth/Throat:     Pharynx: No pharyngeal swelling or oropharyngeal exudate.     Comments: dry Eyes:     Conjunctiva/sclera: Conjunctivae normal.     Pupils: Pupils are equal, round, and reactive to light.  Neck:     Musculoskeletal: Normal range of motion and neck supple.  Cardiovascular:     Rate and Rhythm: Normal rate and regular rhythm.     Heart sounds: No murmur.  Pulmonary:     Effort: Pulmonary effort is normal. No respiratory distress.     Breath sounds: Examination of the left-upper field reveals rhonchi. Rhonchi present. No wheezing or rales.  Abdominal:     General: There is no distension.     Palpations: Abdomen is soft.     Tenderness: There is no abdominal tenderness. There is no guarding or rebound.  Musculoskeletal: Normal range of motion.        General: No tenderness.  Lymphadenopathy:     Cervical: No cervical adenopathy.  Skin:    General: Skin is warm and dry.     Capillary Refill: Capillary refill takes 2 to 3 seconds.     Findings: No erythema or rash.  Neurological:     General: No focal deficit present.     Mental Status: He is alert and oriented to person, place, and time. Mental status is at baseline.  Psychiatric:        Mood  and Affect: Mood normal.        Behavior: Behavior normal.        Thought Content: Thought content normal.      ED Treatments / Results  Labs (all labs ordered are listed, but only abnormal results are displayed) Labs Reviewed - No data to display  EKG None  Radiology Dg Chest [facility]  Result Date: 01/05/2019 CLINICAL DATA:  Shortness of breath. EXAM: PORTABLE CHEST 1 VIEW COMPARISON:  Radiographs of Feb 24, 2016. FINDINGS: Stable cardiomediastinal silhouette. No pneumothorax or pleural effusion is noted. Both lungs are clear. The visualized skeletal structures are unremarkable. IMPRESSION: No active disease. Electronically Signed   By: Lupita Raider, M.D.   On: 01/05/2019 17:12    Procedures Procedures (including critical care time)  Medications Ordered in ED Medications  lactated ringers bolus 1,000 mL (has no administration in time range)     Initial Impression / Assessment and Plan / ED Course  I have reviewed the triage vital signs and the nursing notes.  Pertinent labs & imaging results that were available during my care of the patient were reviewed by me and considered in my medical decision making (see chart for details).        33 year old presenting with more GI complaints and anything else with vomiting and diarrhea on Sunday and then with continued diarrhea.  He does complain of feeling slightly short of breath but denies any wheezing.  States he does not feel like his asthma.  He has not had significant cough or congestion.  He did have a fever the day of the nausea and vomiting but no other days.  Patient did have positive sick contacts with similar symptoms.  He also works at [employer] and has contact with public.  Patient is well-appearing on exam with able to speak in full sentences.  O2 sat is 98% on room air.  He has no wheezing but mild rhonchi in the left upper lobe.  Suspect this is most believed to be viral in origin but low suspicion for an  coronavirus.  Patient does seem to be dehydrated and was given IV fluids.  Portable chest x-ray pending  5:16 PM cxr is clear and pt refusing IVF and just wants to hydrate orally.  Will d/c home and feel he is clear to return to work tomorrow.  Final Clinical Impressions(s) / ED Diagnoses   Final diagnoses:  Dehydration  Nausea vomiting and diarrhea    ED Discharge Orders    None       Gwyneth Sprout, MD 01/05/19 1718

## 2019-01-05 NOTE — Discharge Instructions (Signed)
Make sure you are drinking plenty of fluids.  If you develop respiratory sx of cough, shortness or breath, fever or chest pain you should quarantine at home or return to the ER if you become severe.  Use your inhaler as needed for wheezing and asthma sx.

## 2019-01-05 NOTE — ED Triage Notes (Addendum)
Pt reports having fever of 101, N/V/D started Sunday night. Pt reports feeling better since, pt afebrile.  Pt reports a headache. Pt reports hx of asthma, having SHOB on Sunday, denies using inhaler. Pt feels SHOB with exertion, Cp when he takes a deep breath.Pt denies cough. Pt reports his ex-wife had stomach flu 2-3 days before pt's symptoms.

## 2019-01-05 NOTE — ED Notes (Signed)
Portable Xray at bedside.

## 2020-10-10 ENCOUNTER — Emergency Department (HOSPITAL_COMMUNITY)
Admission: EM | Admit: 2020-10-10 | Discharge: 2020-10-10 | Disposition: A | Discharge status: Home / Self Care | Payer: Managed Care, Other (non HMO) | Attending: Emergency Medicine | Admitting: Emergency Medicine

## 2020-10-10 ENCOUNTER — Encounter (HOSPITAL_COMMUNITY): Payer: Self-pay | Admitting: Emergency Medicine

## 2020-10-10 DIAGNOSIS — R509 Fever, unspecified: Secondary | ICD-10-CM | POA: Diagnosis present

## 2020-10-10 DIAGNOSIS — U071 COVID-19: Secondary | ICD-10-CM

## 2020-10-10 DIAGNOSIS — J45909 Unspecified asthma, uncomplicated: Secondary | ICD-10-CM | POA: Diagnosis not present

## 2020-10-10 DIAGNOSIS — Z87891 Personal history of nicotine dependence: Secondary | ICD-10-CM | POA: Diagnosis not present

## 2020-10-10 LAB — RESP PANEL BY RT-PCR (FLU A&B, COVID) ARPGX2
Influenza A by PCR: NEGATIVE
Influenza B by PCR: NEGATIVE
SARS Coronavirus 2 by RT PCR: POSITIVE — AB

## 2020-10-10 MED ORDER — IBUPROFEN 200 MG PO TABS
600.0000 mg | ORAL_TABLET | Freq: Once | ORAL | Status: AC
  Administered 2020-10-10: 600 mg via ORAL
  Filled 2020-10-10: qty 1

## 2020-10-10 MED ORDER — ACETAMINOPHEN 325 MG PO TABS
650.0000 mg | ORAL_TABLET | Freq: Once | ORAL | Status: AC | PRN
  Administered 2020-10-10: 650 mg via ORAL
  Filled 2020-10-10: qty 2

## 2020-10-10 NOTE — ED Notes (Signed)
Pt ambulatory, SpO2 between 96-99% on room air while ambulating.

## 2020-10-10 NOTE — ED Triage Notes (Signed)
Pt reports fever, cough, body aches since Friday.

## 2020-10-10 NOTE — ED Provider Notes (Signed)
[facility] EMERGENCY DEPARTMENT Provider Note   CSN: [id number] Arrival date & time: 10/10/20  1152     History Chief Complaint  Patient presents with  . Fever  . Cough  . Nasal Congestion    Adam Armstrong is a 34 y.o. male hospital history significant for asthma, bronchitis.  Had Covid vaccinations.  HPI Patient presents to emergency department today with chief complaint of fever, cough, and body aches x 4 days.  Patient endorses T-max of 102 yesterday.  His cough is nonproductive.  He has been taking Tylenol and aspirin for his symptoms with transient symptom improvement.  Last dose was yesterday.  He has decreased appetite, he is able to tolerate p.o. intake without difficulty.  He is also endorsing chills and nasal congestion. He states he was around his cousin the day before symptom onset who later tested positive for Covid. His son is home with similar symptoms and tested negative for covid. Denis shortness of breath and chest pain.    Past Medical History:  Diagnosis Date  . Asthma   . Bronchitis   . Hernia     Patient Active Problem List   Diagnosis Date Noted  . Bilateral inguinal hernia s/p lap repair with mesh 04/11/2016 04/11/2016    Past Surgical History:  Procedure Laterality Date  . INGUINAL HERNIA REPAIR Bilateral 04/11/2016   Procedure: LAPAROSCOPIC LEFT AND  RIGHT INGUINAL HERNIA REPAIRS WITH MESH;  Surgeon: Karie Soda, MD;  Location: [facility];  Service: General;  Laterality: Bilateral;  . INSERTION OF MESH Bilateral 04/11/2016   Procedure: INSERTION OF MESH;  Surgeon: Karie Soda, MD;  Location: [facility];  Service: General;  Laterality: Bilateral;       No family history on file.  Social History   Tobacco Use  . Smoking status: Former Smoker    Years: 8.00    Types: Cigarettes    Quit date: 03/24/2016    Years since quitting: 4.5  . Smokeless tobacco: Never Used  Substance Use Topics  . Alcohol use: Yes    Comment: occ  .  Drug use: No    Home Medications Prior to Admission medications   Medication Sig Start Date End Date Taking? Authorizing Provider  acetaminophen (TYLENOL) 500 MG tablet Take 500 mg by mouth every 6 (six) hours as needed for mild pain.    [provider]  albuterol (PROVENTIL HFA;VENTOLIN HFA) 108 (90 BASE) MCG/ACT inhaler Inhale 2 puffs into the lungs every 4 (four) hours as needed for wheezing. 06/14/12 01/05/19  Doug Sou, MD  ibuprofen (ADVIL,MOTRIN) 200 MG tablet Take 200 mg by mouth daily as needed for mild pain.    [provider]  Multiple Vitamin (MULTIVITAMIN WITH MINERALS) TABS tablet Take 1 tablet by mouth daily.    [provider]    Allergies    Patient has no known allergies.  Review of Systems   Review of Systems All other systems are reviewed and are negative for acute change except as noted in the HPI.  Physical Exam Updated Vital Signs BP (!) 147/82 (BP Location: Right Arm)   Pulse 78   Temp (!) 101.1 F (38.4 C) (Oral)   Resp 20   SpO2 99%   Physical Exam Vitals and nursing note reviewed.  Constitutional:      General: He is not in acute distress.    Appearance: He is not ill-appearing.  HENT:     Head: Normocephalic and atraumatic.  Comments: No sinus or temporal tenderness.    Right Ear: Tympanic membrane and external ear normal.     Left Ear: Tympanic membrane and external ear normal.     Nose: Nose normal.     Mouth/Throat:     Mouth: Mucous membranes are moist.     Pharynx: Oropharynx is clear.     Comments: Minor erythema to oropharynx, no edema, no exudate, no tonsillar swelling, voice normal, neck supple without lymphadenopathy  Eyes:     General: No scleral icterus.       Right eye: No discharge.        Left eye: No discharge.     Extraocular Movements: Extraocular movements intact.     Conjunctiva/sclera: Conjunctivae normal.     Pupils: Pupils are equal, round, and reactive to light.  Neck:      Vascular: No JVD.  Cardiovascular:     Rate and Rhythm: Normal rate and regular rhythm.     Pulses: Normal pulses.          Radial pulses are 2+ on the right side and 2+ on the left side.     Heart sounds: Normal heart sounds.  Pulmonary:     Comments: Lungs clear to auscultation in all fields. Symmetric chest rise. No wheezing, rales, or rhonchi.  Oxygen saturation is 99% on room air. Abdominal:     Comments: Abdomen is soft, non-distended, and non-tender in all quadrants. No rigidity, no guarding. No peritoneal signs.  Musculoskeletal:        General: Normal range of motion.     Cervical back: Normal range of motion.  Skin:    General: Skin is warm and dry.     Capillary Refill: Capillary refill takes less than 2 seconds.  Neurological:     Mental Status: He is oriented to person, place, and time.     GCS: GCS eye subscore is 4. GCS verbal subscore is 5. GCS motor subscore is 6.     Comments: Fluent speech, no facial droop.  Psychiatric:        Behavior: Behavior normal.     ED Results / Procedures / Treatments   Labs (all labs ordered are listed, but only abnormal results are displayed) Labs Reviewed  RESP PANEL BY RT-PCR (FLU A&B, COVID) ARPGX2 - Abnormal; Notable for the following components:      Result Value   SARS Coronavirus 2 by RT PCR POSITIVE (*)    All other components within normal limits    EKG None  Radiology No results found.  Procedures Procedures (including critical care time)  Medications Ordered in ED Medications  ibuprofen (ADVIL) tablet 600 mg (has no administration in time range)  acetaminophen (TYLENOL) tablet 650 mg (650 mg Oral Given 10/10/20 1219)    ED Course  I have reviewed the triage vital signs and the nursing notes.  Pertinent labs & imaging results that were available during my care of the patient were reviewed by me and considered in my medical decision making (see chart for details).    MDM Rules/Calculators/A&P                           History provided by patient with additional history obtained from chart review.    History provided by patient with additional history obtained from chart review.     Symptoms and exam most suggestive of uncomplicated viral illness. DDX incluldes viral URI/LRI, COVID-19.  Exam is  benign.  Patient was febrile to 101.3 triage.  He was given Tylenol.  When temperature rechecked it only down to 101.1.  I checked patient's temperature during exam and was 100.1.  Ibuprofen ordered for fever.  Normal WOB. No tachypnea, tachycardia, hypoxemia. Lungs are CTAB. I do not think that a CXR is indicated at this time as VS are WNL, there are no signs of consolidation on auscultation and there is no hypoxia, increased WOB or other concerning features to exam. No significant h/o immunocompromise. Doubt bacterial bronchitis or pneumonia.  No signs or symptoms to suggest strep pharyngitis.  No clinical signs of severe illness, dehydration, to warrant further emergent work up in ER. Covid test is positive. Patient ambulated in the emergency department without respiratory distress or hypoxia, SpO2 >95% on room air.  Given reassuring physical exam, symptoms, will discharge with symptomatic treatment. Recommend telemedicine PCP f/u in the next 2-3 days for persistent symptoms  for further guidance. Self-isolation instructions discussed. Pt was given home self-isolation instructions and instructions for family members.   The patient appears reasonably screened and/or stabilized for discharge and I doubt any other medical condition or other [facility] requiring further screening, evaluation, or treatment in the ED at this time prior to discharge. The patient is safe for discharge with strict return precautions discussed.   JAMILL WETMORE was evaluated in Emergency Department on 10/10/2020 for the symptoms described in the history of present illness. He was evaluated in the context of the global COVID-19 pandemic,  which necessitated consideration that the patient might be at risk for infection with the SARS-CoV-2 virus that causes COVID-19. Institutional protocols and algorithms that pertain to the evaluation of patients at risk for COVID-19 are in a state of rapid change based on information released by regulatory bodies including the CDC and federal and state organizations. These policies and algorithms were followed during the patient's care in the ED.   Portions of this note were generated with Scientist, clinical (histocompatibility and immunogenetics). Dictation errors may occur despite best attempts at proofreading.    Final Clinical Impression(s) / ED Diagnoses Final diagnoses:  COVID    Rx / DC Orders ED Discharge Orders    None       Kandice Hams 10/10/20 1636    Cathren Laine, MD 10/11/20 [phone]

## 2020-10-10 NOTE — Discharge Instructions (Signed)
Thank you for allowing us to care for you today.   Please return to the emergency department if you have any new or worsening symptoms.  You tested positive for covid-19 today.   Medications- You can take medications to help treat your symptoms: -Tylenol for fever and body aches. Please take as prescribed on the bottle. -Over the coutner cough medicine such as mucinex, robitussin, or other brands. -Flonase or saline nasal spray for nasal congestion -Vitamins as recommended by CDC  Treatment- This is a virus and unfortunately there are no antibitotics approved to treat this virus at this time. It is important to monitor your symptoms closely: -You should have a theremometer at home to check your temperature when feeling feverish. -Use a pulse ox meter to measure your oxygen when feeling short of breath.  -If your fever is over 100.4 despite taking tylenol or if your oxygen level drops below 94% these are reasons to return to the emergency department for further evaluation.   -You need to quarantine for 10 days starting today.  You can return to work, school or normal activities if on day 10 you are fever free without the use of Tylenol or ibuprofen.  You will need to continue quarantine if you still have a fever over 100.4.  Again: symptoms of shortness of breath, chest pain, difficulty breathing, new onset of confusion, any symptoms that are concerning. If any of these symptoms you should come to emergency department for evaluation.   I hope you feel better soon   

## 2020-10-31 ENCOUNTER — Emergency Department (HOSPITAL_COMMUNITY)
Admission: EM | Admit: 2020-10-31 | Discharge: 2020-11-01 | Disposition: A | Discharge status: Home / Self Care | Payer: Self-pay | Attending: Emergency Medicine | Admitting: Emergency Medicine

## 2020-10-31 ENCOUNTER — Other Ambulatory Visit: Payer: Self-pay

## 2020-10-31 ENCOUNTER — Emergency Department (HOSPITAL_COMMUNITY): Payer: Self-pay

## 2020-10-31 ENCOUNTER — Encounter (HOSPITAL_COMMUNITY): Payer: Self-pay | Admitting: *Deleted

## 2020-10-31 DIAGNOSIS — M7989 Other specified soft tissue disorders: Secondary | ICD-10-CM

## 2020-10-31 DIAGNOSIS — J45909 Unspecified asthma, uncomplicated: Secondary | ICD-10-CM | POA: Insufficient documentation

## 2020-10-31 DIAGNOSIS — M25571 Pain in right ankle and joints of right foot: Secondary | ICD-10-CM | POA: Insufficient documentation

## 2020-10-31 DIAGNOSIS — M79604 Pain in right leg: Secondary | ICD-10-CM

## 2020-10-31 DIAGNOSIS — M79661 Pain in right lower leg: Secondary | ICD-10-CM | POA: Insufficient documentation

## 2020-10-31 DIAGNOSIS — Z87891 Personal history of nicotine dependence: Secondary | ICD-10-CM | POA: Insufficient documentation

## 2020-10-31 NOTE — ED Triage Notes (Signed)
Pt c/o right leg pain since Saturday after playing basketball. Pain in the posterior ankle, radiates up through the calf area. Swelling to the area. Ibuprofen 800mg  at 10am.

## 2020-11-01 NOTE — Discharge Instructions (Addendum)
I have concerns for possible Achilles tendon injury.  I would like you to use the crutches bear weight as tolerated with the boot, continue to keep your leg elevated and ice it whenever possible.  Take ibuprofen and Tylenol as described below and follow-up with your orthopedist in about a week.  Return to [place] with any concerns or call to orthopedic group for any concerns.  You can take 600 mg of ibuprofen every 6 hours, you can take 1000 mg of Tylenol every 6 hours, you can alternate these every 3 or you can take them together.

## 2020-11-01 NOTE — ED Notes (Signed)
Unable to get vitals a this time due to ortho tech at bedside. Will obtained before discharge.

## 2020-11-01 NOTE — ED Provider Notes (Signed)
[facility] EMERGENCY DEPARTMENT Provider Note   CSN: [id number] Arrival date & time: 10/31/20  2254     History Chief Complaint  Patient presents with  . Leg Pain    Adam Armstrong is a 35 y.o. male.   Leg Pain Location:  Ankle and leg Leg location:  R lower leg Ankle location:  R ankle Pain details:    Quality:  Aching   Radiates to:  Does not radiate   Severity:  Moderate   Onset quality:  Gradual   Duration:  3 days   Timing:  Constant   Progression:  Partially resolved Chronicity:  New Relieved by:  Ice, elevation and rest Worsened by:  Bearing weight Ineffective treatments:  None tried Associated symptoms: decreased ROM and swelling   Associated symptoms: no back pain, no fever, no numbness and no stiffness        Past Medical History:  Diagnosis Date  . Asthma   . Bronchitis   . Hernia     Patient Active Problem List   Diagnosis Date Noted  . Bilateral inguinal hernia s/p lap repair with mesh 04/11/2016 04/11/2016    Past Surgical History:  Procedure Laterality Date  . INGUINAL HERNIA REPAIR Bilateral 04/11/2016   Procedure: LAPAROSCOPIC LEFT AND  RIGHT INGUINAL HERNIA REPAIRS WITH MESH;  Surgeon: Karie Soda, MD;  Location: [facility];  Service: General;  Laterality: Bilateral;  . INSERTION OF MESH Bilateral 04/11/2016   Procedure: INSERTION OF MESH;  Surgeon: Karie Soda, MD;  Location: [facility];  Service: General;  Laterality: Bilateral;       No family history on file.  Social History   Tobacco Use  . Smoking status: Former Smoker    Years: 8.00    Types: Cigarettes    Quit date: 03/24/2016    Years since quitting: 4.6  . Smokeless tobacco: Never Used  Substance Use Topics  . Alcohol use: Yes    Comment: occ  . Drug use: No    Home Medications Prior to Admission medications   Medication Sig Start Date End Date Taking? Authorizing Provider  acetaminophen (TYLENOL) 500 MG tablet Take 500 mg by mouth every 6 (six)  hours as needed for mild pain.    [provider]  albuterol (PROVENTIL HFA;VENTOLIN HFA) 108 (90 BASE) MCG/ACT inhaler Inhale 2 puffs into the lungs every 4 (four) hours as needed for wheezing. 06/14/12 01/05/19  Doug Sou, MD  ibuprofen (ADVIL,MOTRIN) 200 MG tablet Take 200 mg by mouth daily as needed for mild pain.    [provider]  Multiple Vitamin (MULTIVITAMIN WITH MINERALS) TABS tablet Take 1 tablet by mouth daily.    [provider]    Allergies    Patient has no known allergies.  Review of Systems   Review of Systems  Constitutional: Negative for chills and fever.  HENT: Negative for congestion and rhinorrhea.   Respiratory: Negative for cough and shortness of breath.   Cardiovascular: Negative for chest pain and palpitations.  Gastrointestinal: Negative for diarrhea, nausea and vomiting.  Genitourinary: Negative for difficulty urinating and dysuria.  Musculoskeletal: Positive for arthralgias and joint swelling. Negative for back pain and stiffness.  Skin: Negative for color change and rash.  Neurological: Negative for light-headedness and headaches.    Physical Exam Updated Vital Signs BP 134/68 (BP Location: Right Arm)   Pulse 63   Temp 98 F (36.7 C) (Oral)   Resp 16   Wt 103.6 kg   SpO2  100%   BMI 32.77 kg/m   Physical Exam Vitals and nursing note reviewed.  Constitutional:      General: He is not in acute distress.    Appearance: Normal appearance.  HENT:     Head: Normocephalic and atraumatic.     Nose: No rhinorrhea.  Eyes:     General:        Right eye: No discharge.        Left eye: No discharge.     Conjunctiva/sclera: Conjunctivae normal.  Cardiovascular:     Rate and Rhythm: Normal rate and regular rhythm.  Pulmonary:     Effort: Pulmonary effort is normal.     Breath sounds: No stridor.  Abdominal:     General: Abdomen is flat. There is no distension.     Palpations: Abdomen is soft.  Musculoskeletal:         General: Swelling and tenderness present. No deformity or signs of injury.     Comments: Patient has noticeable swelling about the right ankle and distal leg tenderness to palpation of the distal gastrocnemius, some mild faint bruising, able to plantarflex and dorsiflex the foot neurovascularly intact no bony tenderness of the malleolus  Skin:    General: Skin is warm and dry.  Neurological:     General: No focal deficit present.     Mental Status: He is alert. Mental status is at baseline.     Motor: No weakness.  Psychiatric:        Mood and Affect: Mood normal.        Behavior: Behavior normal.        Thought Content: Thought content normal.     ED Results / Procedures / Treatments   Labs (all labs ordered are listed, but only abnormal results are displayed) Labs Reviewed - No data to display  EKG None  Radiology DG Ankle Complete Right  Result Date: 10/31/2020 CLINICAL DATA:  Right ankle pain, heard pop EXAM: RIGHT ANKLE - COMPLETE 3+ VIEW COMPARISON:  None. FINDINGS: There is no evidence of fracture, dislocation, or joint effusion. Plantar and calcaneal spur. There is no evidence of severe arthropathy or other focal bone abnormality. Soft tissues are unremarkable. IMPRESSION: Negative. Electronically Signed   By: Tish Frederickson M.D.   On: 10/31/2020 23:33    Procedures Procedures (including critical care time)  Medications Ordered in ED Medications - No data to display  ED Course  I have reviewed the triage vital signs and the nursing notes.  Pertinent labs & imaging results that were available during my care of the patient were reviewed by me and considered in my medical decision making (see chart for details).    MDM Rules/Calculators/A&P                          Concern for possible calcaneal tendon injury.  Not likely full rupture, will immobilize give crutches and have outpatient follow-up.  X-ray imaging reviewed by radiology myself shows no bony abnormality.   Safer discharge home return precautions provided Final Clinical Impression(s) / ED Diagnoses Final diagnoses:  Right leg pain  Right leg swelling    Rx / DC Orders ED Discharge Orders    None       Sabino Donovan, MD 11/01/20 [phone]

## 2020-11-01 NOTE — ED Notes (Signed)
Patient denied pain medication.

## 2020-11-06 DIAGNOSIS — S86011A Strain of right Achilles tendon, initial encounter: Secondary | ICD-10-CM | POA: Diagnosis not present

## 2020-11-08 DIAGNOSIS — S86011D Strain of right Achilles tendon, subsequent encounter: Secondary | ICD-10-CM | POA: Diagnosis not present

## 2020-11-09 DIAGNOSIS — S86011A Strain of right Achilles tendon, initial encounter: Secondary | ICD-10-CM | POA: Diagnosis not present

## 2020-11-09 DIAGNOSIS — Y9367 Activity, basketball: Secondary | ICD-10-CM | POA: Diagnosis not present

## 2020-11-09 DIAGNOSIS — M66361 Spontaneous rupture of flexor tendons, right lower leg: Secondary | ICD-10-CM | POA: Diagnosis not present

## 2020-11-09 DIAGNOSIS — X58XXXA Exposure to other specified factors, initial encounter: Secondary | ICD-10-CM | POA: Diagnosis not present

## 2020-11-09 DIAGNOSIS — G8918 Other acute postprocedural pain: Secondary | ICD-10-CM | POA: Diagnosis not present

## 2020-11-22 DIAGNOSIS — S86011D Strain of right Achilles tendon, subsequent encounter: Secondary | ICD-10-CM | POA: Diagnosis not present

## 2020-12-06 DIAGNOSIS — M66361 Spontaneous rupture of flexor tendons, right lower leg: Secondary | ICD-10-CM | POA: Diagnosis not present

## 2020-12-19 DIAGNOSIS — S86011D Strain of right Achilles tendon, subsequent encounter: Secondary | ICD-10-CM | POA: Diagnosis not present

## 2020-12-19 DIAGNOSIS — M25571 Pain in right ankle and joints of right foot: Secondary | ICD-10-CM | POA: Diagnosis not present

## 2020-12-22 DIAGNOSIS — M25571 Pain in right ankle and joints of right foot: Secondary | ICD-10-CM | POA: Diagnosis not present

## 2020-12-22 DIAGNOSIS — S83011D Lateral subluxation of right patella, subsequent encounter: Secondary | ICD-10-CM | POA: Diagnosis not present

## 2020-12-27 DIAGNOSIS — M25571 Pain in right ankle and joints of right foot: Secondary | ICD-10-CM | POA: Diagnosis not present

## 2020-12-27 DIAGNOSIS — S86011D Strain of right Achilles tendon, subsequent encounter: Secondary | ICD-10-CM | POA: Diagnosis not present

## 2021-01-02 DIAGNOSIS — S86011D Strain of right Achilles tendon, subsequent encounter: Secondary | ICD-10-CM | POA: Diagnosis not present

## 2021-01-02 DIAGNOSIS — M25571 Pain in right ankle and joints of right foot: Secondary | ICD-10-CM | POA: Diagnosis not present

## 2021-01-03 DIAGNOSIS — S86011D Strain of right Achilles tendon, subsequent encounter: Secondary | ICD-10-CM | POA: Diagnosis not present

## 2021-01-04 DIAGNOSIS — M25571 Pain in right ankle and joints of right foot: Secondary | ICD-10-CM | POA: Diagnosis not present

## 2021-01-04 DIAGNOSIS — S86011D Strain of right Achilles tendon, subsequent encounter: Secondary | ICD-10-CM | POA: Diagnosis not present

## 2021-01-17 DIAGNOSIS — M25571 Pain in right ankle and joints of right foot: Secondary | ICD-10-CM | POA: Diagnosis not present

## 2021-01-17 DIAGNOSIS — S86011D Strain of right Achilles tendon, subsequent encounter: Secondary | ICD-10-CM | POA: Diagnosis not present

## 2021-02-19 IMAGING — CR DG ANKLE COMPLETE 3+V*R*
3 series · 3 of 3 positions shown · non-contrast
Comparison: None.

CLINICAL DATA: Right ankle pain, heard pop

EXAM:
RIGHT ANKLE - COMPLETE 3+ VIEW

[ankle ap]
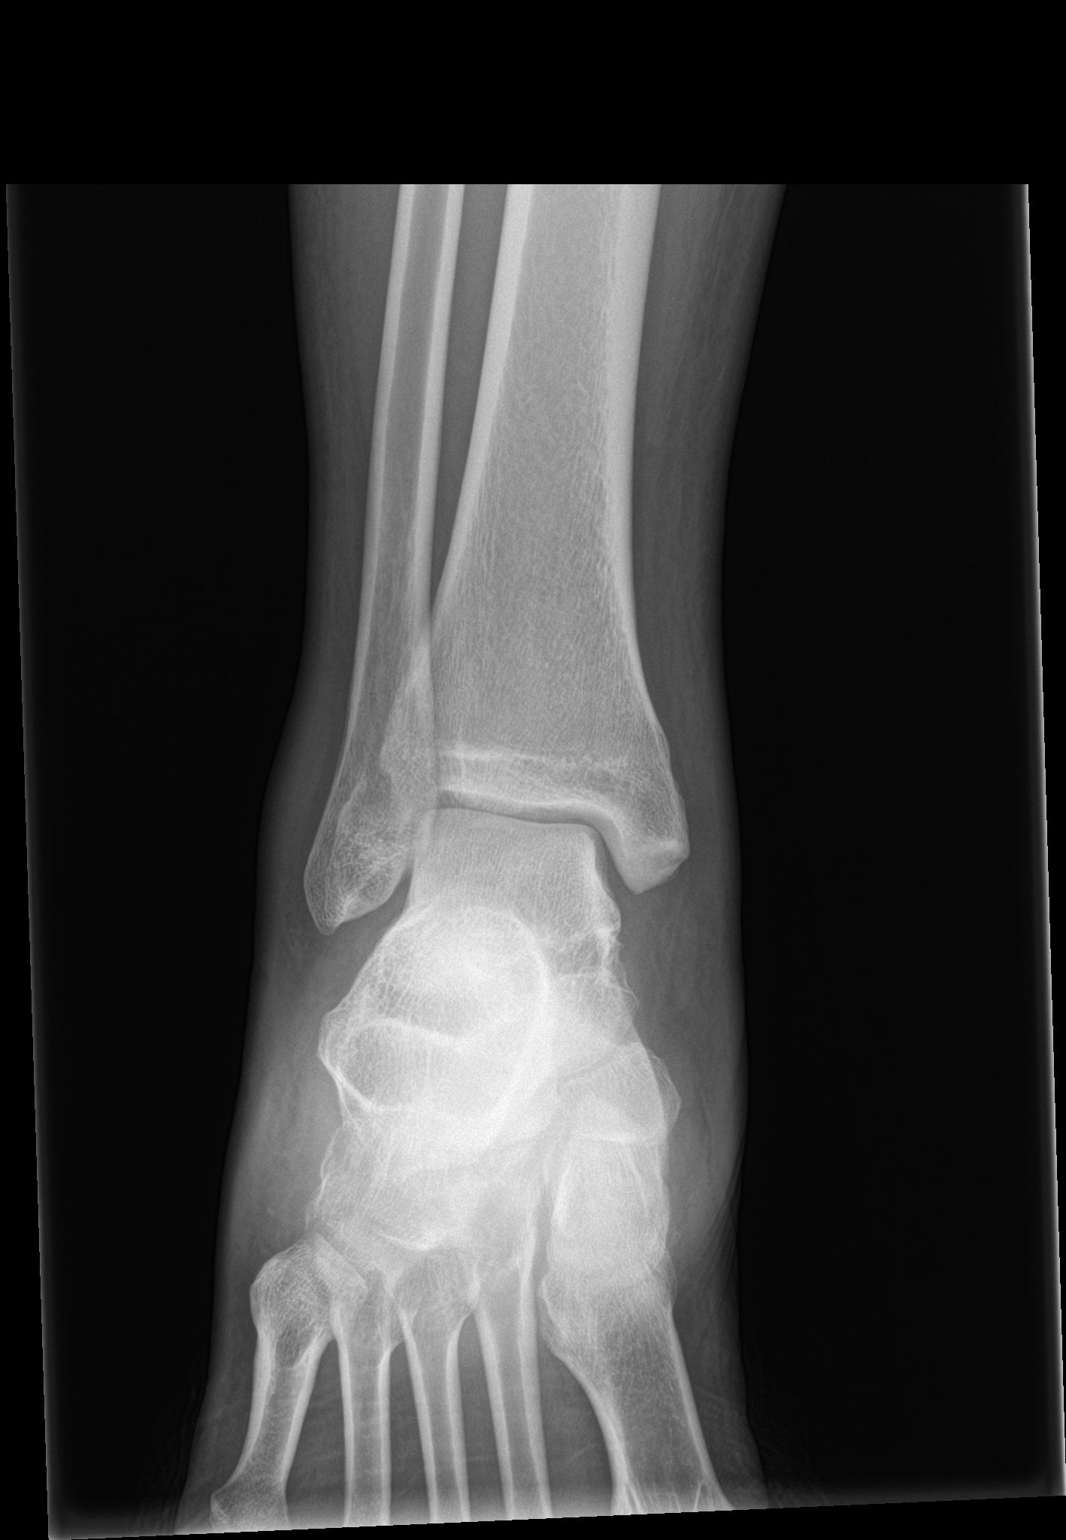

[ankle obl]
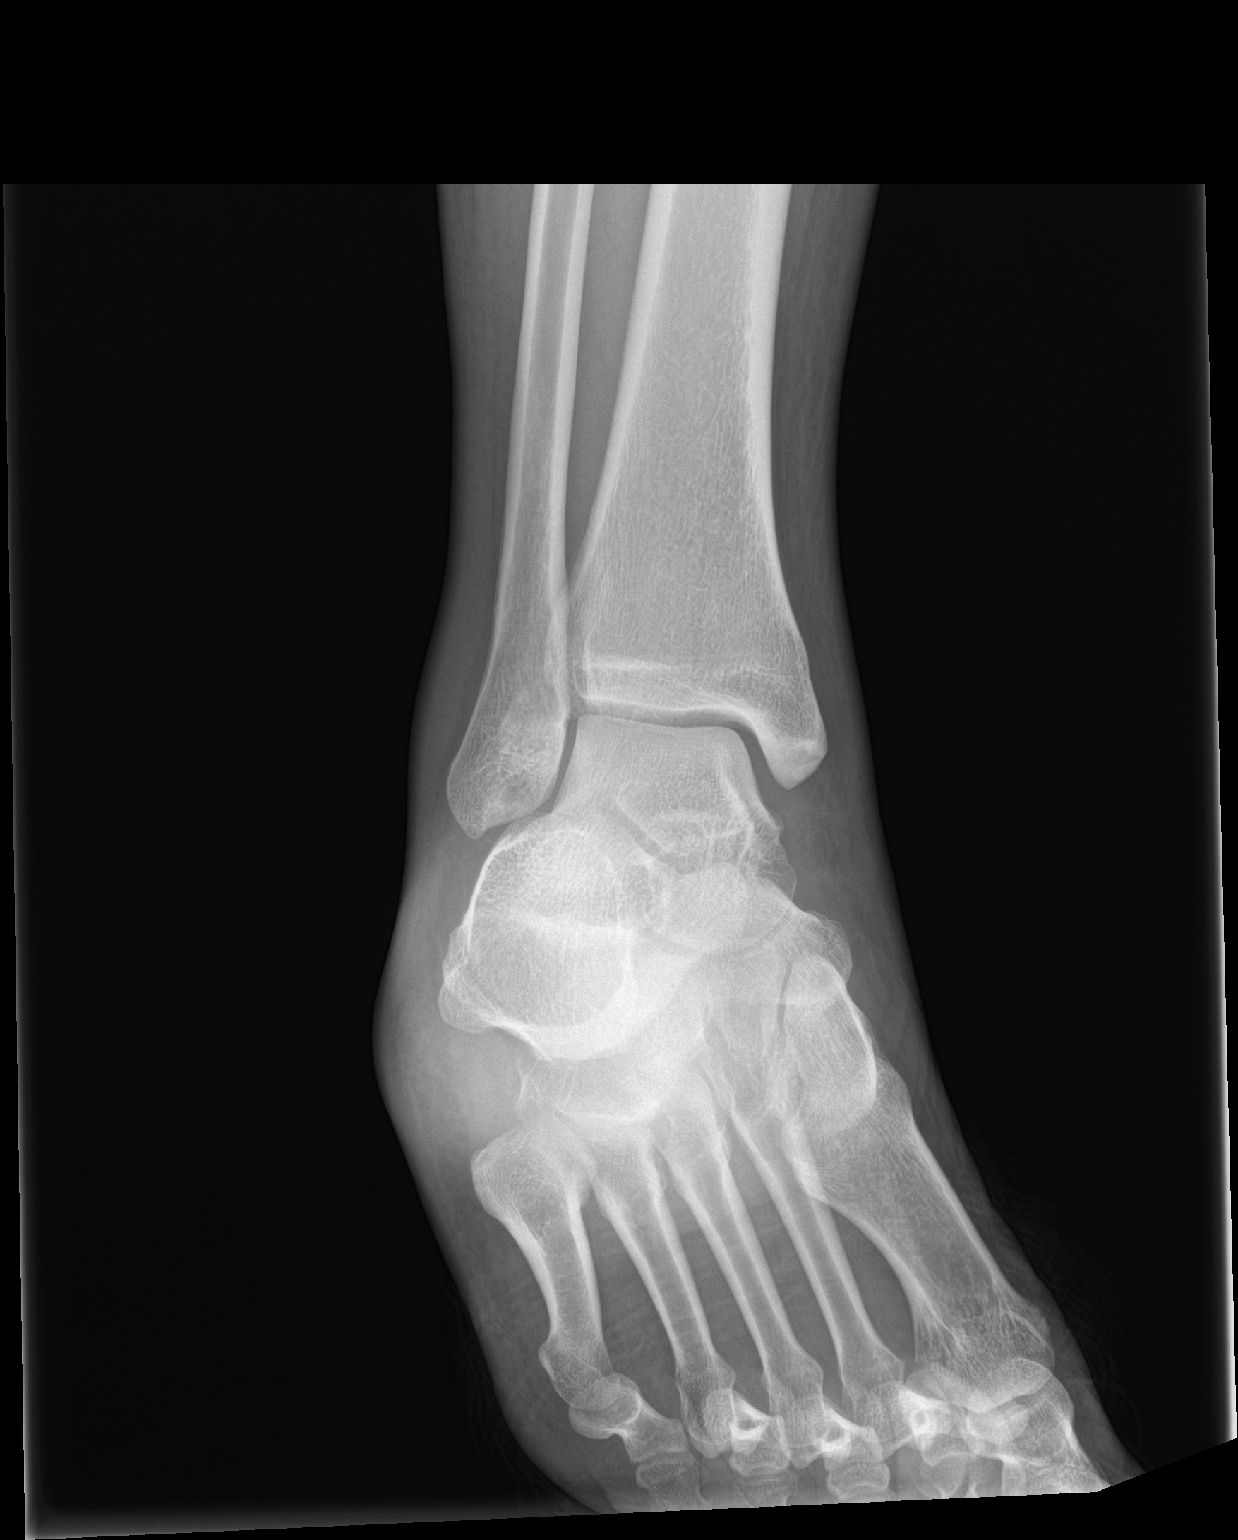

[ankle lat]
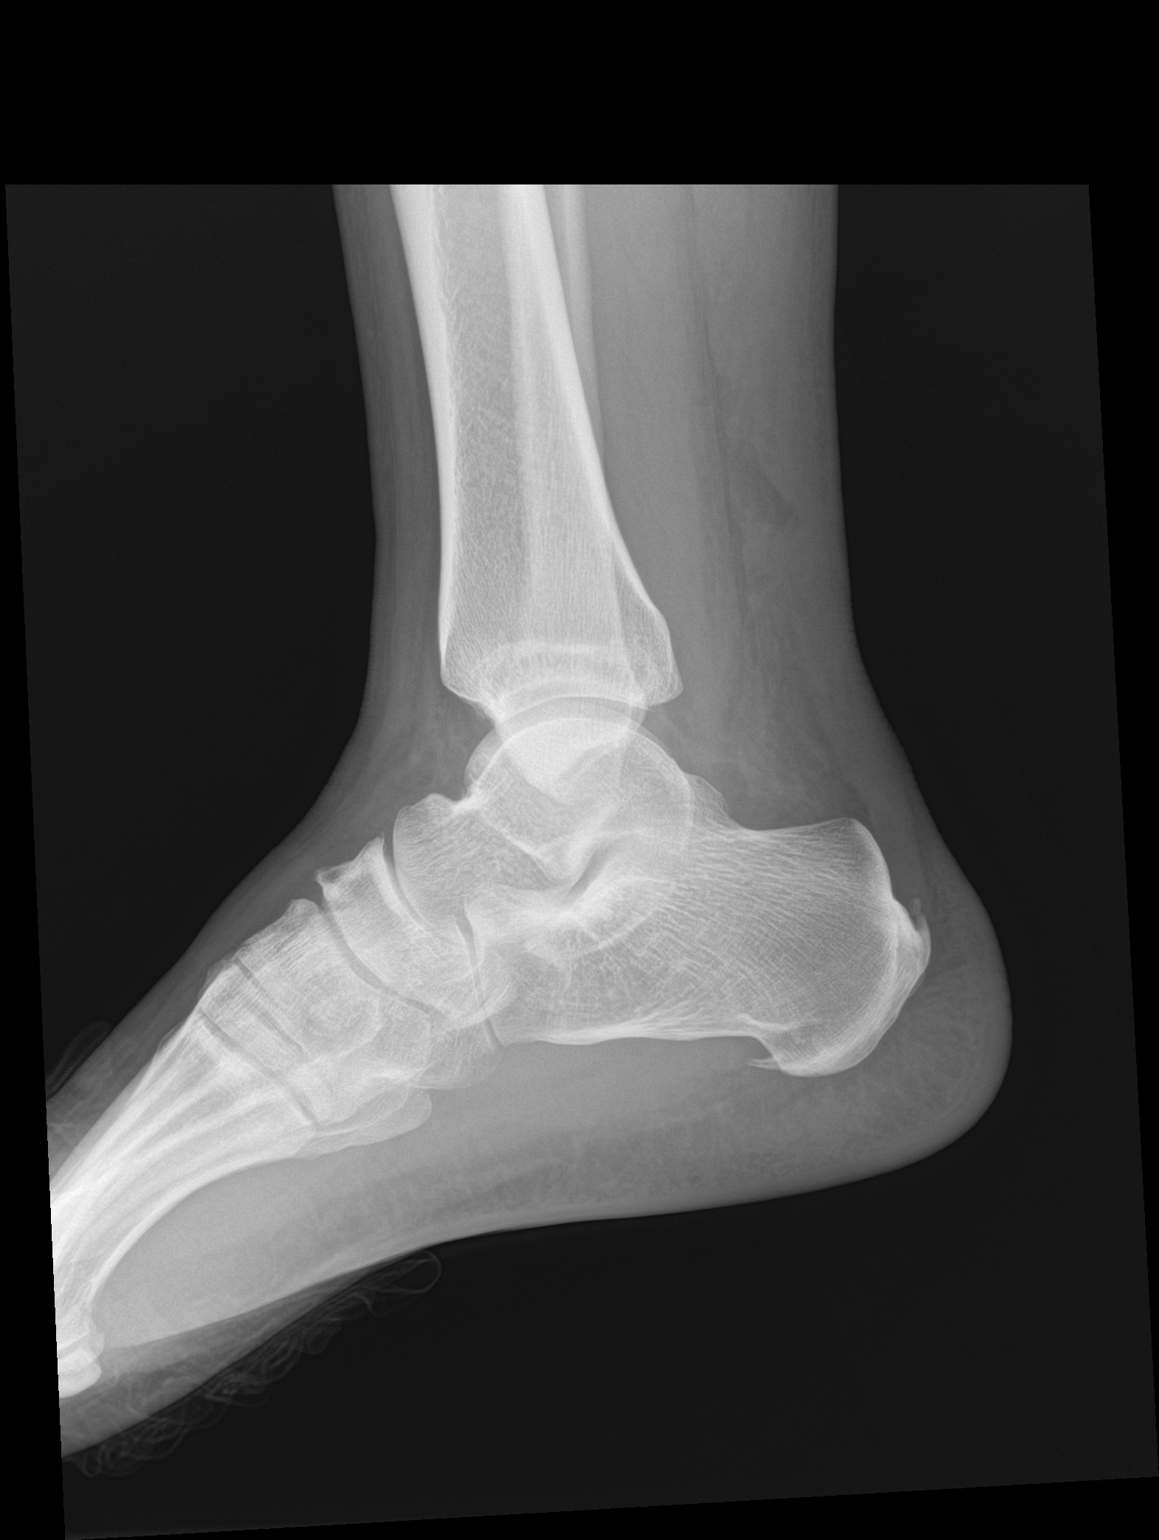

[3 of 3 positions shown; findings below may reference images not displayed]

FINDINGS: There is no evidence of fracture, dislocation, or joint effusion.
Plantar and calcaneal spur. There is no evidence of severe
arthropathy or other focal bone abnormality. Soft tissues are
unremarkable.
IMPRESSION: Negative.

## 2021-03-23 DIAGNOSIS — L255 Unspecified contact dermatitis due to plants, except food: Secondary | ICD-10-CM | POA: Diagnosis not present

## 2021-07-26 DIAGNOSIS — Z20822 Contact with and (suspected) exposure to covid-19: Secondary | ICD-10-CM | POA: Diagnosis not present

## 2021-08-02 DIAGNOSIS — Z20822 Contact with and (suspected) exposure to covid-19: Secondary | ICD-10-CM | POA: Diagnosis not present

## 2021-08-02 DIAGNOSIS — J029 Acute pharyngitis, unspecified: Secondary | ICD-10-CM | POA: Diagnosis not present

## 2021-09-11 DIAGNOSIS — Z20822 Contact with and (suspected) exposure to covid-19: Secondary | ICD-10-CM | POA: Diagnosis not present

## 2021-09-11 DIAGNOSIS — R059 Cough, unspecified: Secondary | ICD-10-CM | POA: Diagnosis not present

## 2021-09-11 DIAGNOSIS — R6883 Chills (without fever): Secondary | ICD-10-CM | POA: Diagnosis not present

## 2021-09-11 DIAGNOSIS — J029 Acute pharyngitis, unspecified: Secondary | ICD-10-CM | POA: Diagnosis not present

## 2021-10-18 ENCOUNTER — Encounter (HOSPITAL_BASED_OUTPATIENT_CLINIC_OR_DEPARTMENT_OTHER): Payer: Self-pay | Admitting: Emergency Medicine

## 2021-10-18 ENCOUNTER — Emergency Department (HOSPITAL_BASED_OUTPATIENT_CLINIC_OR_DEPARTMENT_OTHER)
Admission: EM | Admit: 2021-10-18 | Discharge: 2021-10-18 | Disposition: A | Discharge status: Home / Self Care | Payer: BC Managed Care – PPO | Attending: Emergency Medicine | Admitting: Emergency Medicine

## 2021-10-18 ENCOUNTER — Other Ambulatory Visit: Payer: Self-pay

## 2021-10-18 DIAGNOSIS — Z20822 Contact with and (suspected) exposure to covid-19: Secondary | ICD-10-CM | POA: Diagnosis not present

## 2021-10-18 DIAGNOSIS — Z79899 Other long term (current) drug therapy: Secondary | ICD-10-CM | POA: Insufficient documentation

## 2021-10-18 DIAGNOSIS — J020 Streptococcal pharyngitis: Secondary | ICD-10-CM | POA: Insufficient documentation

## 2021-10-18 DIAGNOSIS — R0602 Shortness of breath: Secondary | ICD-10-CM | POA: Diagnosis not present

## 2021-10-18 LAB — RESP PANEL BY RT-PCR (FLU A&B, COVID) ARPGX2
Influenza A by PCR: NEGATIVE
Influenza B by PCR: NEGATIVE
SARS Coronavirus 2 by RT PCR: NEGATIVE

## 2021-10-18 LAB — GROUP A STREP BY PCR: Group A Strep by PCR: DETECTED — AB

## 2021-10-18 MED ORDER — ALBUTEROL SULFATE HFA 108 (90 BASE) MCG/ACT IN AERS
2.0000 | INHALATION_SPRAY | RESPIRATORY_TRACT | Status: DC | PRN
  Administered 2021-10-18: 2 via RESPIRATORY_TRACT
  Filled 2021-10-18: qty 6.7

## 2021-10-18 MED ORDER — ACETAMINOPHEN 500 MG PO TABS: 1000.0000 mg | ORAL_TABLET | Freq: Four times a day (QID) | ORAL | 0 refills | Status: AC | PRN

## 2021-10-18 MED ORDER — PENICILLIN G BENZATHINE 1200000 UNIT/2ML IM SUSY
1.2000 10*6.[IU] | PREFILLED_SYRINGE | Freq: Once | INTRAMUSCULAR | Status: AC
  Administered 2021-10-18: 1.2 10*6.[IU] via INTRAMUSCULAR
  Filled 2021-10-18: qty 2

## 2021-10-18 MED ORDER — AEROCHAMBER PLUS FLO-VU MEDIUM MISC
1.0000 | Freq: Once | Status: AC
  Administered 2021-10-18: 1
  Filled 2021-10-18: qty 1

## 2021-10-18 MED ORDER — IPRATROPIUM-ALBUTEROL 0.5-2.5 (3) MG/3ML IN SOLN
3.0000 mL | Freq: Once | RESPIRATORY_TRACT | Status: AC
  Administered 2021-10-18: 3 mL via RESPIRATORY_TRACT
  Filled 2021-10-18: qty 3

## 2021-10-18 MED ORDER — IBUPROFEN 800 MG PO TABS
800.0000 mg | ORAL_TABLET | Freq: Once | ORAL | Status: AC
  Administered 2021-10-18: 800 mg via ORAL
  Filled 2021-10-18: qty 1

## 2021-10-18 MED ORDER — LIDOCAINE VISCOUS HCL 2 % MT SOLN: 15.0000 mL | OROMUCOSAL | 0 refills | Status: AC | PRN

## 2021-10-18 NOTE — ED Provider Notes (Signed)
[facility] EMERGENCY DEPT Provider Note   CSN: [id number] Arrival date & time: 10/18/21  1432     History  Chief Complaint  Patient presents with   Shortness of Breath    Adam Armstrong is a 36 y.o. male.  The history is provided by the patient. No language interpreter was used.  Shortness of Breath  36 year old male presenting complaining of sore throat and trouble breathing.  Home Medications Prior to Admission medications   Medication Sig Start Date End Date Taking? Authorizing Provider  acetaminophen (TYLENOL) 500 MG tablet Take 500 mg by mouth every 6 (six) hours as needed for mild pain.    [provider]  albuterol (PROVENTIL HFA;VENTOLIN HFA) 108 (90 BASE) MCG/ACT inhaler Inhale 2 puffs into the lungs every 4 (four) hours as needed for wheezing. 06/14/12 01/05/19  Orlie Dakin, MD  ibuprofen (ADVIL,MOTRIN) 200 MG tablet Take 200 mg by mouth daily as needed for mild pain.    [provider]  Multiple Vitamin (MULTIVITAMIN WITH MINERALS) TABS tablet Take 1 tablet by mouth daily.    [provider]      Allergies    Patient has no known allergies.    Review of Systems   Review of Systems  Respiratory:  Positive for shortness of breath.   All other systems reviewed and are negative.  Physical Exam Updated Vital Signs BP (!) 154/91 (BP Location: Left Arm)    Pulse 91    Temp 98.6 F (37 C)    Resp 18    Ht 5\' 9"  (1.753 m)    Wt 116.1 kg    SpO2 96%    BMI 37.80 kg/m  Physical Exam Vitals and nursing note reviewed.  Constitutional:      General: He is not in acute distress.    Appearance: He is well-developed.  HENT:     Head: Atraumatic.     Mouth/Throat:     Comments: Uvula midline, posterior oropharyngeal erythema noted, bilateral tonsillar enlargement 2+ without exudates.  No trismus and no hypoglossal edema. Eyes:     Conjunctiva/sclera: Conjunctivae normal.  Cardiovascular:     Rate and Rhythm: Normal rate and  regular rhythm.  Pulmonary:     Effort: Pulmonary effort is normal.     Breath sounds: Normal breath sounds. No decreased breath sounds, wheezing, rhonchi or rales.  Chest:     Chest wall: No tenderness.  Abdominal:     Palpations: Abdomen is soft.  Musculoskeletal:     Cervical back: Neck supple.  Skin:    Findings: No rash.  Neurological:     Mental Status: He is alert.    ED Results / Procedures / Treatments   Labs (all labs ordered are listed, but only abnormal results are displayed) Labs Reviewed  GROUP A STREP BY PCR - Abnormal; Notable for the following components:      Result Value   Group A Strep by PCR DETECTED (*)    All other components within normal limits  RESP PANEL BY RT-PCR (FLU A&B, COVID) [id number]    EKG None  Radiology No results found.  Procedures Procedures    Medications Ordered in ED Medications  albuterol (VENTOLIN HFA) 108 (90 Base) MCG/ACT inhaler 2 puff (2 puffs Inhalation Given 10/18/21 1958)  penicillin g benzathine (BICILLIN LA) 1200000 UNIT/2ML injection 1.2 Million Units (1.2 Million Units Intramuscular Given 10/18/21 1953)  ibuprofen (ADVIL) tablet 800 mg (800 mg Oral Given 10/18/21 1951)  ipratropium-albuterol (DUONEB) 0.5-2.5 (3)  MG/3ML nebulizer solution 3 mL (3 mLs Nebulization Given 10/18/21 1957)  AeroChamber Plus Flo-Vu Medium MISC 1 each (1 each Other Given 10/18/21 1958)    ED Course/ Medical Decision Making/ A&P                           Medical Decision Making  BP (!) 154/91 (BP Location: Left Arm)    Pulse 91    Temp 98.6 F (37 C)    Resp 18    Ht 5\' 9"  (1.753 m)    Wt 116.1 kg    SpO2 96%    BMI 37.80 kg/m   7:22 PM This is a 36 year old male with significant past medical history pertinent for asthma, bronchitis, who presents with complaints of sore throat and trouble breathing.  Patient report 5 days ago he was working at [employer] doing [employer] and he was exposed to quite a bit of dust.  Since then he has had increase  cough, shortness of breath, throat irritation, discomfort in the left side of his throat, and congestion.  He has been taking over-the-counter medication and drink plenty of fluid to help with the symptom.  His daughter had strep infection 2 weeks ago and his wife thought that he may have strep infection.  He has history of bronchitis and asthma and does have a rescue inhaler that he used intermittently.  He has been using it for about 4 times in the past few days with some improvement.  He denies nausea vomiting or diarrhea denies rash denies any pleuritic chest pain or exertional shortness of breath.  He was seen earlier at urgent care and had a chest x-ray there and told that it was negative for pneumonia  On exam, patient is well-appearing, acute appears to be in no acute respiratory discomfort.  Heart with normal rate and rhythm no murmur rubs or gallops, lungs with occasional expiratory wheezes when cough without any rales.  Throat exam showed midline uvula with bilateral tonsillar enlargement without exudates.  No peritonsillar abscess.  Evidence of posterior oropharyngeal erythema noted no trismus.  Patient has normal phonation no airway compromise.          Final Clinical Impression(s) / ED Diagnoses Final diagnoses:  Strep pharyngitis    Rx / DC Orders ED Discharge Orders          Ordered    lidocaine (XYLOCAINE) 2 % solution  As needed        10/18/21 2112    acetaminophen (TYLENOL) 500 MG tablet  Every 6 hours PRN        10/18/21 2112              Domenic Moras, PA-C 10/18/21 2114    Fredia Sorrow, MD 10/25/21 [phone]

## 2021-10-18 NOTE — ED Notes (Signed)
RT educated pt on proper use of MDI w/spacer. Pt able to perform w/out difficulty. RT suggested pt seek referral to Pulmonology for PFT. Pt verbalized understanding of teaching and information. RT will continue to monitor.

## 2021-10-18 NOTE — ED Triage Notes (Addendum)
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

## 2021-10-18 NOTE — Discharge Instructions (Signed)
You have been diagnosed with strep infection.  You did receive a one-time dose of antibiotic that should cover for the infection.  Please take medication prescribed as needed for your symptoms.  Use albuterol inhaler as needed for shortness of breath.

## 2021-11-19 DIAGNOSIS — Z03818 Encounter for observation for suspected exposure to other biological agents ruled out: Secondary | ICD-10-CM | POA: Diagnosis not present

## 2021-11-19 DIAGNOSIS — R062 Wheezing: Secondary | ICD-10-CM | POA: Diagnosis not present

## 2021-11-19 DIAGNOSIS — J029 Acute pharyngitis, unspecified: Secondary | ICD-10-CM | POA: Diagnosis not present

## 2022-02-08 DIAGNOSIS — Z20822 Contact with and (suspected) exposure to covid-19: Secondary | ICD-10-CM | POA: Diagnosis not present

## 2022-02-08 DIAGNOSIS — Z03818 Encounter for observation for suspected exposure to other biological agents ruled out: Secondary | ICD-10-CM | POA: Diagnosis not present

## 2023-01-13 DIAGNOSIS — Z419 Encounter for procedure for purposes other than remedying health state, unspecified: Secondary | ICD-10-CM | POA: Diagnosis not present

## 2023-02-12 DIAGNOSIS — Z419 Encounter for procedure for purposes other than remedying health state, unspecified: Secondary | ICD-10-CM | POA: Diagnosis not present

## 2023-03-15 DIAGNOSIS — Z419 Encounter for procedure for purposes other than remedying health state, unspecified: Secondary | ICD-10-CM | POA: Diagnosis not present

## 2023-04-14 DIAGNOSIS — Z419 Encounter for procedure for purposes other than remedying health state, unspecified: Secondary | ICD-10-CM | POA: Diagnosis not present
